# Patient Record
Sex: Male | Born: 1960 | Race: White | Hispanic: No | Marital: Married | Smoking: Never smoker
Health system: Southern US, Community
[De-identification: ages and names within clinical notes are randomized; demographics above are authoritative.]

## PROBLEM LIST (undated history)

## (undated) DIAGNOSIS — I10 Essential (primary) hypertension: Secondary | ICD-10-CM

## (undated) DIAGNOSIS — Z87898 Personal history of other specified conditions: Secondary | ICD-10-CM

## (undated) DIAGNOSIS — J45909 Unspecified asthma, uncomplicated: Secondary | ICD-10-CM

## (undated) DIAGNOSIS — T7840XA Allergy, unspecified, initial encounter: Secondary | ICD-10-CM

## (undated) HISTORY — DX: Allergy, unspecified, initial encounter: T78.40XA

## (undated) HISTORY — PX: NO PAST SURGERIES: SHX2092

## (undated) HISTORY — DX: Unspecified asthma, uncomplicated: J45.909

## (undated) HISTORY — PX: TREATMENT FISTULA ANAL: SUR1390

## (undated) HISTORY — DX: Essential (primary) hypertension: I10

## (undated) HISTORY — PX: MELANOMA EXCISION: SHX5266

---

## 2015-07-26 ENCOUNTER — Ambulatory Visit (INDEPENDENT_AMBULATORY_CARE_PROVIDER_SITE_OTHER): Payer: 59 | Admitting: Family Medicine

## 2015-07-26 ENCOUNTER — Encounter: Payer: Self-pay | Admitting: Family Medicine

## 2015-07-26 VITALS — BP 100/56 | HR 84 | Ht 70.0 in | Wt 225.0 lb

## 2015-07-26 DIAGNOSIS — W57XXXA Bitten or stung by nonvenomous insect and other nonvenomous arthropods, initial encounter: Secondary | ICD-10-CM | POA: Diagnosis not present

## 2015-07-26 DIAGNOSIS — K219 Gastro-esophageal reflux disease without esophagitis: Secondary | ICD-10-CM

## 2015-07-26 DIAGNOSIS — J309 Allergic rhinitis, unspecified: Secondary | ICD-10-CM | POA: Diagnosis not present

## 2015-07-26 DIAGNOSIS — E559 Vitamin D deficiency, unspecified: Secondary | ICD-10-CM

## 2015-07-26 DIAGNOSIS — I10 Essential (primary) hypertension: Secondary | ICD-10-CM | POA: Insufficient documentation

## 2015-07-26 DIAGNOSIS — R002 Palpitations: Secondary | ICD-10-CM | POA: Diagnosis not present

## 2015-07-26 DIAGNOSIS — S30861A Insect bite (nonvenomous) of abdominal wall, initial encounter: Secondary | ICD-10-CM | POA: Diagnosis not present

## 2015-07-26 DIAGNOSIS — Z79899 Other long term (current) drug therapy: Secondary | ICD-10-CM | POA: Diagnosis not present

## 2015-07-26 DIAGNOSIS — J4520 Mild intermittent asthma, uncomplicated: Secondary | ICD-10-CM | POA: Diagnosis not present

## 2015-07-26 DIAGNOSIS — E669 Obesity, unspecified: Secondary | ICD-10-CM | POA: Diagnosis not present

## 2015-07-26 MED ORDER — OMEPRAZOLE 20 MG PO CPDR: 20.0000 mg | DELAYED_RELEASE_CAPSULE | Freq: Every day | ORAL | Status: DC | PRN

## 2015-07-26 MED ORDER — ALBUTEROL SULFATE HFA 108 (90 BASE) MCG/ACT IN AERS: 2.0000 | INHALATION_SPRAY | RESPIRATORY_TRACT | Status: DC | PRN

## 2015-07-26 MED ORDER — LISINOPRIL-HYDROCHLOROTHIAZIDE 20-12.5 MG PO TABS: 1.0000 | ORAL_TABLET | Freq: Every day | ORAL | Status: DC

## 2015-07-27 DIAGNOSIS — Z79899 Other long term (current) drug therapy: Secondary | ICD-10-CM | POA: Insufficient documentation

## 2015-07-27 DIAGNOSIS — K219 Gastro-esophageal reflux disease without esophagitis: Secondary | ICD-10-CM | POA: Insufficient documentation

## 2015-07-27 DIAGNOSIS — J4520 Mild intermittent asthma, uncomplicated: Secondary | ICD-10-CM | POA: Insufficient documentation

## 2015-07-27 DIAGNOSIS — R002 Palpitations: Secondary | ICD-10-CM | POA: Insufficient documentation

## 2015-07-27 DIAGNOSIS — E559 Vitamin D deficiency, unspecified: Secondary | ICD-10-CM | POA: Insufficient documentation

## 2015-07-27 LAB — LIPID PANEL
CHOL/HDL RATIO: 5.2 ratio — AB (ref 0.0–5.0)
Cholesterol, Total: 173 mg/dL (ref 100–199)
HDL: 33 mg/dL — ABNORMAL LOW (ref >39)
LDL CALC: 98 mg/dL (ref 0–99)
TRIGLYCERIDES: 208 mg/dL — AB (ref 0–149)
VLDL CHOLESTEROL CAL: 42 mg/dL — AB (ref 5–40)

## 2015-07-27 LAB — VITAMIN B12: Vitamin B-12: 371 pg/mL (ref 211–946)

## 2015-07-27 LAB — CBC
HEMATOCRIT: 45.8 % (ref 37.5–51.0)
HEMOGLOBIN: 16.3 g/dL (ref 12.6–17.7)
MCH: 31 pg (ref 26.6–33.0)
MCHC: 35.6 g/dL (ref 31.5–35.7)
MCV: 87 fL (ref 79–97)
Platelets: 249 10*3/uL (ref 150–379)
RBC: 5.25 x10E6/uL (ref 4.14–5.80)
RDW: 13.7 % (ref 12.3–15.4)
WBC: 11 10*3/uL — ABNORMAL HIGH (ref 3.4–10.8)

## 2015-07-27 LAB — COMPREHENSIVE METABOLIC PANEL
ALT: 19 IU/L (ref 0–44)
AST: 15 IU/L (ref 0–40)
Albumin/Globulin Ratio: 2.3 (ref 1.1–2.5)
Albumin: 4.4 g/dL (ref 3.5–5.5)
Alkaline Phosphatase: 59 IU/L (ref 39–117)
BUN/Creatinine Ratio: 18 (ref 9–20)
BUN: 20 mg/dL (ref 6–24)
Bilirubin Total: 0.5 mg/dL (ref 0.0–1.2)
CALCIUM: 9.3 mg/dL (ref 8.7–10.2)
CHLORIDE: 98 mmol/L (ref 97–106)
CO2: 25 mmol/L (ref 18–29)
Creatinine, Ser: 1.1 mg/dL (ref 0.76–1.27)
GFR, EST AFRICAN AMERICAN: 86 mL/min/{1.73_m2} (ref >59)
GFR, EST NON AFRICAN AMERICAN: 75 mL/min/{1.73_m2} (ref >59)
GLUCOSE: 87 mg/dL (ref 65–99)
Globulin, Total: 1.9 g/dL (ref 1.5–4.5)
Potassium: 4.7 mmol/L (ref 3.5–5.2)
Sodium: 140 mmol/L (ref 136–144)
TOTAL PROTEIN: 6.3 g/dL (ref 6.0–8.5)

## 2015-07-27 LAB — TSH: TSH: 1.49 u[IU]/mL (ref 0.450–4.500)

## 2015-07-27 LAB — VITAMIN D 25 HYDROXY (VIT D DEFICIENCY, FRACTURES): Vit D, 25-Hydroxy: 9.8 ng/mL — ABNORMAL LOW (ref 30.0–100.0)

## 2015-07-27 MED ORDER — VITAMIN D 50 MCG (2000 UT) PO CAPS: 1.0000 | ORAL_CAPSULE | Freq: Every day | ORAL | Status: DC

## 2015-07-27 NOTE — Progress Notes (Signed)
Date:  07/26/2015   Name:  Ronald Fry   DOB:  Jul 25, 1961   MRN:  [id number]  PCP:  No primary care provider on file.    Chief Complaint: Establish Care   History of Present Illness:  This is a 54 y.o. male recently relocated from [place] with tick bite R lateral abdomen, tick removed yesterday, probably there since last week. Mild erythema/itching, looks less red today. Hx HTN on Prinzide for past year, no se's noted. On Prilosec for GERD off and on for years, thinks he can probably wean off. Hx AR in spring and fall, well controlled on Zyrtec/Flonase, has had allergy testing and allergy shots in past. Told TG high in past. Hx asthma associated with AR, well controlled on prn albuterol MDI. Hx heart palpitations had neg cardiac w/u two years ago, none since starting Prinzide last year. Weight up 5# past 6 months, no regular exercise. Tetanus 4 yrs ago, flu imm last month, has also had pneumovax and zoster imms. Had colonoscopy 6 yrs ago no abnormal findings.  Review of Systems:  Review of Systems  Constitutional: Negative for fever, chills and fatigue.  HENT: Negative for ear pain, sore throat and trouble swallowing.   Eyes: Negative for pain.  Respiratory: Negative for cough and shortness of breath.   Cardiovascular: Negative for chest pain and leg swelling.  Gastrointestinal: Negative for abdominal pain, diarrhea and constipation.  Endocrine: Negative for polyuria.  Genitourinary: Negative for difficulty urinating.  Neurological: Negative for syncope and light-headedness.    Patient Active Problem List   Diagnosis Date Noted  . Intermittent palpitations 07/27/2015  . Esophageal reflux 07/27/2015  . Asthma, mild intermittent 07/27/2015  . Rhinitis, allergic 07/27/2015  . Current use of proton pump inhibitor 07/27/2015  . Hypertension 07/26/2015  . Obesity, Class I, BMI 30-34.9 07/26/2015    Prior to Admission medications   Medication Sig Start Date End Date Taking?  Authorizing Provider  albuterol (PROVENTIL HFA;VENTOLIN HFA) 108 (90 BASE) MCG/ACT inhaler Inhale 2 puffs into the lungs every 4 (four) hours as needed for wheezing or shortness of breath. 07/26/15  Yes Adline Potter, MD  aspirin 81 MG tablet Take 81 mg by mouth daily.   Yes Historical Provider, MD  cetirizine (ZYRTEC) 10 MG tablet Take 10 mg by mouth daily.   Yes Historical Provider, MD  fluticasone (FLONASE) 50 MCG/ACT nasal spray Place into both nostrils daily.   Yes Historical Provider, MD  lisinopril-hydrochlorothiazide (PRINZIDE,ZESTORETIC) 20-12.5 MG tablet Take 1 tablet by mouth daily. 07/26/15  Yes Adline Potter, MD  omeprazole (PRILOSEC) 20 MG capsule Take 1 capsule (20 mg total) by mouth daily as needed. 07/26/15  Yes Adline Potter, MD    No Known Allergies  No past surgical history on file.  Social History  Substance Use Topics  . Smoking status: Never Smoker   . Smokeless tobacco: None  . Alcohol Use: 0.0 oz/week    0 Standard drinks or equivalent per week    Family History  Problem Relation Age of Onset  . Stroke Mother   . Stroke Father     Medication list has been reviewed and updated.  Physical Examination: BP 100/56 mmHg  Pulse 84  Ht 5\' 10"  (1.778 m)  Wt 225 lb (102.059 kg)  BMI 32.28 kg/m2  SpO2 96%  Physical Exam  Constitutional: He is oriented to person, place, and time. He appears well-developed and well-nourished.  HENT:  Head: Normocephalic and atraumatic.  Right Ear: External ear normal.  Left  Ear: External ear normal.  Nose: Nose normal.  Mouth/Throat: Oropharynx is clear and moist.  TM's clear  Eyes: Conjunctivae and EOM are normal. Pupils are equal, round, and reactive to light.  Neck: Neck supple. No thyromegaly present.  Cardiovascular: Normal rate, regular rhythm and normal heart sounds.   Pulmonary/Chest: Effort normal and breath sounds normal.  Abdominal: Soft. He exhibits no distension and no mass. There is no tenderness.   Genitourinary: Penis normal.  Testes normal, no hernia  Musculoskeletal: He exhibits no edema.  Lymphadenopathy:    He has no cervical adenopathy.  Neurological: He is alert and oriented to person, place, and time.  Skin: Skin is warm and dry.  Bite R flank with minimal erythema, no discharge  Psychiatric: He has a normal mood and affect. His behavior is normal.  Nursing note and vitals reviewed.   Assessment and Plan:  1. Tick bite of abdomen, initial encounter Healing well, observe for ECM or fever  2. Essential hypertension Well controlled, continue Prinzide - Comprehensive Metabolic Panel (CMET) - CBC  3. Obesity, Class I, BMI 30-34.9 Discussed exercise 150 mins/wk and weight loss - Lipid Profile - Vitamin D (25 hydroxy) - TSH  4. Allergic rhinitis, unspecified allergic rhinitis type Seasonal, well controlled, cont prn Zyrtec/Flonase  5. Asthma, mild intermittent, uncomplicated Well controlled, cont prn albuterol MDI  6. Gastroesophageal reflux disease, esophagitis presence not specified Discussed potential se's chronic PPI use, will try to wean off  7. Current use of proton pump inhibitor - B12  8. Intermittent palpitations S/p negative cardiac w/u, discussed possible BB use if recur  Return in about 6 months (around 01/23/2016).  Satira Anis. Catheryne Deford, [facility]  07/27/2015

## 2015-07-27 NOTE — Addendum Note (Signed)
Addended by: Adline Potter on: 07/27/2015 09:29 AM   Modules accepted: Orders, Medications

## 2015-10-28 ENCOUNTER — Encounter: Payer: Self-pay | Admitting: Family Medicine

## 2015-10-28 ENCOUNTER — Ambulatory Visit (INDEPENDENT_AMBULATORY_CARE_PROVIDER_SITE_OTHER): Payer: 59 | Admitting: Family Medicine

## 2015-10-28 VITALS — BP 110/68 | HR 72 | Ht 70.0 in | Wt 225.0 lb

## 2015-10-28 DIAGNOSIS — E669 Obesity, unspecified: Secondary | ICD-10-CM | POA: Diagnosis not present

## 2015-10-28 DIAGNOSIS — K219 Gastro-esophageal reflux disease without esophagitis: Secondary | ICD-10-CM

## 2015-10-28 DIAGNOSIS — J309 Allergic rhinitis, unspecified: Secondary | ICD-10-CM

## 2015-10-28 DIAGNOSIS — E559 Vitamin D deficiency, unspecified: Secondary | ICD-10-CM

## 2015-10-28 DIAGNOSIS — R002 Palpitations: Secondary | ICD-10-CM

## 2015-10-28 DIAGNOSIS — M7662 Achilles tendinitis, left leg: Secondary | ICD-10-CM

## 2015-10-28 DIAGNOSIS — I10 Essential (primary) hypertension: Secondary | ICD-10-CM | POA: Diagnosis not present

## 2015-10-28 DIAGNOSIS — Z8639 Personal history of other endocrine, nutritional and metabolic disease: Secondary | ICD-10-CM | POA: Insufficient documentation

## 2015-10-28 NOTE — Progress Notes (Addendum)
Date:  10/28/2015   Name:  Ronald Fry   DOB:  08-24-1961   MRN:  [id number]  PCP:  No primary care provider on file.    Chief Complaint: Tendonitis   History of Present Illness:  This is a 55 y.o. male for 4 month f/u. Injured L Achilles in June, more painful lately (up to 5/10), worse with prolonged walking. Weight stable, AR stable on Flonase/Zyrtec, weaned off Prilosec (none in past 6 weeks), palpitations improved, taking vit D supplement. Hx low testosterone on shots in past, none in past year.  Review of Systems:  Review of Systems  Constitutional: Negative for fever and unexpected weight change.  Respiratory: Negative for shortness of breath.   Cardiovascular: Negative for chest pain and leg swelling.  Endocrine: Negative for polyuria.  Genitourinary: Negative for difficulty urinating.  Neurological: Negative for syncope and light-headedness.    Patient Active Problem List   Diagnosis Date Noted  . Hx of hypotestosteronemia 10/28/2015  . Intermittent palpitations 07/27/2015  . Esophageal reflux 07/27/2015  . Asthma, mild intermittent 07/27/2015  . Rhinitis, allergic 07/27/2015  . Vitamin D deficiency 07/27/2015  . Hypertension 07/26/2015  . Obesity, Class I, BMI 30-34.9 07/26/2015    Prior to Admission medications   Medication Sig Start Date End Date Taking? Authorizing Provider  albuterol (PROVENTIL HFA;VENTOLIN HFA) 108 (90 BASE) MCG/ACT inhaler Inhale 2 puffs into the lungs every 4 (four) hours as needed for wheezing or shortness of breath. 07/26/15  Yes Adline Potter, MD  cetirizine (ZYRTEC) 10 MG tablet Take 10 mg by mouth daily.   Yes Historical Provider, MD  Cholecalciferol (VITAMIN D) 2000 UNITS CAPS Take 1 capsule (2,000 Units total) by mouth daily. 07/27/15  Yes Adline Potter, MD  fluticasone (FLONASE) 50 MCG/ACT nasal spray Place into both nostrils daily.   Yes Historical Provider, MD  lisinopril-hydrochlorothiazide (PRINZIDE,ZESTORETIC) 20-12.5 MG tablet  Take 1 tablet by mouth daily. 07/26/15  Yes Adline Potter, MD  omeprazole (PRILOSEC) 20 MG capsule Take 1 capsule (20 mg total) by mouth daily as needed. 07/26/15  Yes Adline Potter, MD    No Known Allergies  Past Surgical History  Procedure Laterality Date  . No past surgeries      Social History  Substance Use Topics  . Smoking status: Never Smoker   . Smokeless tobacco: None  . Alcohol Use: 0.0 oz/week    0 Standard drinks or equivalent per week    Family History  Problem Relation Age of Onset  . Stroke Mother   . Stroke Father     Medication list has been reviewed and updated.  Physical Examination: BP 110/68 mmHg  Pulse 72  Ht 5\' 10"  (1.778 m)  Wt 225 lb (102.059 kg)  BMI 32.28 kg/m2  Physical Exam  Constitutional: He appears well-developed and well-nourished.  Cardiovascular: Normal rate, regular rhythm and normal heart sounds.   Pulmonary/Chest: Effort normal and breath sounds normal.  Musculoskeletal: He exhibits no edema.  Tender nodule L mid Achilles  Neurological: He is alert.  Skin: Skin is warm and dry.  Psychiatric: He has a normal mood and affect. His behavior is normal.  Nursing note and vitals reviewed.   Assessment and Plan:  1. Essential hypertension Overcontrolled past two visits, d/c Prinzide  2. Vitamin D deficiency On supplement - Vitamin D (25 hydroxy)  3. Gastroesophageal reflux disease, esophagitis presence not specified On PPI prn only  4. Allergic rhinitis, unspecified allergic rhinitis type Cont Flonase/Zyrtec, consider Allegra or Claritin if Zyrtec  ineffective  5. Obesity, Class I, BMI 30-34.9 Exercise/weight loss discussed  6. Intermittent palpitations Improved  7. Hx of hypotestosteronemia - Testosterone  8. L Achilles tendonitis Refer podiatry  Return in about 4 weeks (around 11/25/2015).  Satira Anis. [facility]  10/28/2015

## 2015-10-28 NOTE — Progress Notes (Signed)
Addendum:  Primary dx L Achilles tendonitis - Refer podiatry

## 2015-10-28 NOTE — Addendum Note (Signed)
Addended by: Adline Potter on: 10/28/2015 04:44 PM   Modules accepted: Orders

## 2015-11-01 DIAGNOSIS — E559 Vitamin D deficiency, unspecified: Secondary | ICD-10-CM | POA: Diagnosis not present

## 2015-11-01 DIAGNOSIS — Z8639 Personal history of other endocrine, nutritional and metabolic disease: Secondary | ICD-10-CM | POA: Diagnosis not present

## 2015-11-02 ENCOUNTER — Telehealth: Payer: Self-pay

## 2015-11-02 DIAGNOSIS — E349 Endocrine disorder, unspecified: Secondary | ICD-10-CM | POA: Insufficient documentation

## 2015-11-02 LAB — VITAMIN D 25 HYDROXY (VIT D DEFICIENCY, FRACTURES): Vit D, 25-Hydroxy: 26.4 ng/mL — ABNORMAL LOW (ref 30.0–100.0)

## 2015-11-02 LAB — TESTOSTERONE: Testosterone: 329 ng/dL — ABNORMAL LOW (ref 348–1197)

## 2015-11-02 NOTE — Telephone Encounter (Signed)
Spoke with patient. Patient advised of all results and verbalized understanding. Will call back with any future questions or concerns. MAH  

## 2015-11-02 NOTE — Telephone Encounter (Signed)
-----   Message from Adline Potter, MD sent at 11/02/2015 10:12 AM EST ----- Inform vit D level improved and testosterone level low, recommend continue current vit D supplement, will discuss testosterone next visit.

## 2015-11-17 DIAGNOSIS — M79672 Pain in left foot: Secondary | ICD-10-CM | POA: Diagnosis not present

## 2015-11-17 DIAGNOSIS — M7662 Achilles tendinitis, left leg: Secondary | ICD-10-CM | POA: Diagnosis not present

## 2015-12-01 ENCOUNTER — Ambulatory Visit (INDEPENDENT_AMBULATORY_CARE_PROVIDER_SITE_OTHER): Payer: 59 | Admitting: Family Medicine

## 2015-12-01 ENCOUNTER — Encounter: Payer: Self-pay | Admitting: Family Medicine

## 2015-12-01 VITALS — BP 118/82 | HR 66 | Temp 98.3°F | Resp 16 | Ht 70.0 in | Wt 228.0 lb

## 2015-12-01 DIAGNOSIS — E669 Obesity, unspecified: Secondary | ICD-10-CM

## 2015-12-01 DIAGNOSIS — J309 Allergic rhinitis, unspecified: Secondary | ICD-10-CM | POA: Diagnosis not present

## 2015-12-01 DIAGNOSIS — I10 Essential (primary) hypertension: Secondary | ICD-10-CM

## 2015-12-01 DIAGNOSIS — E291 Testicular hypofunction: Secondary | ICD-10-CM | POA: Diagnosis not present

## 2015-12-01 DIAGNOSIS — E66811 Obesity, class 1: Secondary | ICD-10-CM

## 2015-12-01 DIAGNOSIS — K219 Gastro-esophageal reflux disease without esophagitis: Secondary | ICD-10-CM | POA: Diagnosis not present

## 2015-12-01 DIAGNOSIS — E559 Vitamin D deficiency, unspecified: Secondary | ICD-10-CM

## 2015-12-01 DIAGNOSIS — E349 Endocrine disorder, unspecified: Secondary | ICD-10-CM

## 2015-12-01 NOTE — Progress Notes (Signed)
Date:  12/01/2015   Name:  Ronald Fry   DOB:  [birth year]   MRN:  [id number]  PCP:  Adline Potter, MD    Chief Complaint: Hypertension   History of Present Illness:  This is a 55 y.o. male seen for one month f/u. Saw podiatry for Achilles tendonitis, better with Lodine and heel lift. Prinzide stopped last visit. Taking omeprazole prn only. On daily Zyrtec/Flonase for allergies with adequate control. Weight up 3#, unable to exercise with tendonitis. Blood work showed vit D def and and low testosterone, taking vit D supplement.   Review of Systems:  Review of Systems  Constitutional: Negative for fever and fatigue.  Respiratory: Negative for cough and shortness of breath.   Cardiovascular: Negative for chest pain and leg swelling.  Endocrine: Negative for polyuria.  Genitourinary: Negative for difficulty urinating.  Neurological: Negative for syncope and light-headedness.    Patient Active Problem List   Diagnosis Date Noted  . Hypotestosteronism 11/02/2015  . Intermittent palpitations 07/27/2015  . Esophageal reflux 07/27/2015  . Asthma, mild intermittent 07/27/2015  . Rhinitis, allergic 07/27/2015  . Vitamin D deficiency 07/27/2015  . Hypertension 07/26/2015  . Obesity, Class I, BMI 30-34.9 07/26/2015    Prior to Admission medications   Medication Sig Start Date End Date Taking? Authorizing Provider  albuterol (PROVENTIL HFA;VENTOLIN HFA) 108 (90 BASE) MCG/ACT inhaler Inhale 2 puffs into the lungs every 4 (four) hours as needed for wheezing or shortness of breath. 07/26/15  Yes Adline Potter, MD  cetirizine (ZYRTEC) 10 MG tablet Take 10 mg by mouth daily.   Yes Historical Provider, MD  Cholecalciferol (VITAMIN D) 2000 UNITS CAPS Take 1 capsule (2,000 Units total) by mouth daily. 07/27/15  Yes Adline Potter, MD  etodolac (LODINE) 500 MG tablet  11/17/15  Yes Historical Provider, MD  fluticasone (FLONASE) 50 MCG/ACT nasal spray Place into both nostrils daily.   Yes Historical  Provider, MD  omeprazole (PRILOSEC) 20 MG capsule Take 1 capsule (20 mg total) by mouth daily as needed. 07/26/15  Yes Adline Potter, MD    No Known Allergies  Past Surgical History  Procedure Laterality Date  . No past surgeries      Social History  Substance Use Topics  . Smoking status: Never Smoker   . Smokeless tobacco: None  . Alcohol Use: 0.0 oz/week    0 Standard drinks or equivalent per week    Family History  Problem Relation Age of Onset  . Stroke Mother   . Stroke Father     Medication list has been reviewed and updated.  Physical Examination: BP 118/82 mmHg  Pulse 66  Temp(Src) 98.3 F (36.8 C) (Oral)  Resp 16  Ht 5\' 10"  (1.778 m)  Wt 228 lb (103.42 kg)  BMI 32.71 kg/m2  SpO2 96%  Physical Exam  Constitutional: He appears well-developed and well-nourished.  Cardiovascular: Normal rate, regular rhythm and normal heart sounds.   Pulmonary/Chest: Effort normal and breath sounds normal.  Musculoskeletal: He exhibits no edema.  Neurological: He is alert.  Skin: Skin is warm and dry.  Psychiatric: He has a normal mood and affect. His behavior is normal.  Nursing note and vitals reviewed.   Assessment and Plan:  1. Essential hypertension BP ok off Prinzide  2. Allergic rhinitis, unspecified allergic rhinitis type Well controlled on Zyrtec/Flonase  3. Gastroesophageal reflux disease, esophagitis presence not specified Taking Prilosec prn only  4. Obesity, Class I, BMI 30-34.9 Discussed diet/exercise, offered MNT referral, pt will consider  5. Hypotestosteronism On injections in past, willing to try weight loss, consider repeat level next visit  6. Vitamin D deficiency On supplement, consider repeat level next visit  Return in about 3 months (around 03/01/2016).  Satira Anis. [facility]  12/01/2015

## 2016-05-11 DIAGNOSIS — H5213 Myopia, bilateral: Secondary | ICD-10-CM | POA: Diagnosis not present

## 2016-08-07 ENCOUNTER — Ambulatory Visit (INDEPENDENT_AMBULATORY_CARE_PROVIDER_SITE_OTHER): Payer: 59 | Admitting: Internal Medicine

## 2016-08-07 ENCOUNTER — Other Ambulatory Visit: Payer: Self-pay | Admitting: *Deleted

## 2016-08-07 ENCOUNTER — Encounter: Payer: Self-pay | Admitting: Internal Medicine

## 2016-08-07 VITALS — BP 120/84 | HR 72 | Temp 98.2°F | Ht 70.0 in | Wt 223.0 lb

## 2016-08-07 DIAGNOSIS — J3089 Other allergic rhinitis: Secondary | ICD-10-CM

## 2016-08-07 DIAGNOSIS — K219 Gastro-esophageal reflux disease without esophagitis: Secondary | ICD-10-CM

## 2016-08-07 DIAGNOSIS — Z23 Encounter for immunization: Secondary | ICD-10-CM

## 2016-08-07 NOTE — Patient Instructions (Signed)
DASH Eating Plan DASH stands for "Dietary Approaches to Stop Hypertension." The DASH eating plan is a healthy eating plan that has been shown to reduce high blood pressure (hypertension). Additional health benefits may include reducing the risk of type 2 diabetes mellitus, heart disease, and stroke. The DASH eating plan may also help with weight loss. What do I need to know about the DASH eating plan? For the DASH eating plan, you will follow these general guidelines:  Choose foods with less than 150 milligrams of sodium per serving (as listed on the food label).  Use salt-free seasonings or herbs instead of table salt or sea salt.  Check with your health care provider or pharmacist before using salt substitutes.  Eat lower-sodium products. These are often labeled as "low-sodium" or "no salt added."  Eat fresh foods. Avoid eating a lot of canned foods.  Eat more vegetables, fruits, and low-fat dairy products.  Choose whole grains. Look for the word "whole" as the first word in the ingredient list.  Choose fish and skinless chicken or turkey more often than red meat. Limit fish, poultry, and meat to 6 oz (170 g) each day.  Limit sweets, desserts, sugars, and sugary drinks.  Choose heart-healthy fats.  Eat more home-cooked food and less restaurant, buffet, and fast food.  Limit fried foods.  Do not fry foods. Cook foods using methods such as baking, boiling, grilling, and broiling instead.  When eating at a restaurant, ask that your food be prepared with less salt, or no salt if possible. What foods can I eat? Seek help from a dietitian for individual calorie needs. Grains  Whole grain or whole wheat bread. Brown rice. Whole grain or whole wheat pasta. Quinoa, bulgur, and whole grain cereals. Low-sodium cereals. Corn or whole wheat flour tortillas. Whole grain cornbread. Whole grain crackers. Low-sodium crackers. Vegetables  Fresh or frozen vegetables (raw, steamed, roasted, or  grilled). Low-sodium or reduced-sodium tomato and vegetable juices. Low-sodium or reduced-sodium tomato sauce and paste. Low-sodium or reduced-sodium canned vegetables. Fruits  All fresh, canned (in natural juice), or frozen fruits. Meat and Other Protein Products  Ground beef (85% or leaner), grass-fed beef, or beef trimmed of fat. Skinless chicken or turkey. Ground chicken or turkey. Pork trimmed of fat. All fish and seafood. Eggs. Dried beans, peas, or lentils. Unsalted nuts and seeds. Unsalted canned beans. Dairy  Low-fat dairy products, such as skim or 1% milk, 2% or reduced-fat cheeses, low-fat ricotta or cottage cheese, or plain low-fat yogurt. Low-sodium or reduced-sodium cheeses. Fats and Oils  Tub margarines without trans fats. Light or reduced-fat mayonnaise and salad dressings (reduced sodium). Avocado. Safflower, olive, or canola oils. Natural peanut or almond butter. Other  Unsalted popcorn and pretzels. The items listed above may not be a complete list of recommended foods or beverages. Contact your dietitian for more options.  What foods are not recommended? Grains  White bread. White pasta. White rice. Refined cornbread. Bagels and croissants. Crackers that contain trans fat. Vegetables  Creamed or fried vegetables. Vegetables in a cheese sauce. Regular canned vegetables. Regular canned tomato sauce and paste. Regular tomato and vegetable juices. Fruits  Canned fruit in light or heavy syrup. Fruit juice. Meat and Other Protein Products  Fatty cuts of meat. Ribs, chicken wings, bacon, sausage, bologna, salami, chitterlings, fatback, hot dogs, bratwurst, and packaged luncheon meats. Salted nuts and seeds. Canned beans with salt. Dairy  Whole or 2% milk, cream, half-and-half, and cream cheese. Whole-fat or sweetened yogurt. Full-fat cheeses   or blue cheese. Nondairy creamers and whipped toppings. Processed cheese, cheese spreads, or cheese curds. Condiments  Onion and garlic  salt, seasoned salt, table salt, and sea salt. Canned and packaged gravies. Worcestershire sauce. Tartar sauce. Barbecue sauce. Teriyaki sauce. Soy sauce, including reduced sodium. Steak sauce. Fish sauce. Oyster sauce. Cocktail sauce. Horseradish. Ketchup and mustard. Meat flavorings and tenderizers. Bouillon cubes. Hot sauce. Tabasco sauce. Marinades. Taco seasonings. Relishes. Fats and Oils  Butter, stick margarine, lard, shortening, ghee, and bacon fat. Coconut, palm kernel, or palm oils. Regular salad dressings. Other  Pickles and olives. Salted popcorn and pretzels. The items listed above may not be a complete list of foods and beverages to avoid. Contact your dietitian for more information.  Where can I find more information? National Heart, Lung, and Blood Institute: www.nhlbi.nih.gov/health/health-topics/topics/dash/ This information is not intended to replace advice given to you by your health care provider. Make sure you discuss any questions you have with your health care provider. Document Released: 08/03/2011 Document Revised: 01/20/2016 Document Reviewed: 06/18/2013 Elsevier Interactive Patient Education  2017 Elsevier Inc.  

## 2016-08-07 NOTE — Progress Notes (Signed)
Date:  08/07/2016   Name:  Ronald Fry   DOB:  Apr 20, 1961   MRN:  [id number]   Chief Complaint: Hypertension and Flu Vaccine Hypertension  The current episode started more than 1 year ago. The problem has been resolved since onset. The problem is controlled. Pertinent negatives include no chest pain, headaches, palpitations or shortness of breath. There are no associated agents to hypertension. Past treatments include ACE inhibitors. The current treatment provides moderate improvement. There are no compliance problems (was able to stop medications after some time).   Gastroesophageal Reflux  He complains of heartburn. He reports no abdominal pain, no chest pain, no coughing, no sore throat or no wheezing. This is a recurrent problem. The problem occurs occasionally. The symptoms are aggravated by certain foods and lying down. Pertinent negatives include no fatigue. He has tried a PPI (omeprazole PRN) for the symptoms. The treatment provided significant relief.  Asthma/allergies -takes medications seasonally as needed.  Needs to get flu shot today.  BP Readings from Last 3 Encounters:  08/07/16 138/72  12/01/15 118/82  10/28/15 110/68   Wt Readings from Last 3 Encounters:  08/07/16 223 lb (101.2 kg)  12/01/15 228 lb (103.4 kg)  10/28/15 225 lb (102.1 kg)     Review of Systems  Constitutional: Negative for appetite change, fatigue and unexpected weight change.  HENT: Negative for dental problem, sore throat and tinnitus.   Eyes: Negative for visual disturbance.  Respiratory: Negative for cough, chest tightness, shortness of breath and wheezing.   Cardiovascular: Negative for chest pain, palpitations and leg swelling.  Gastrointestinal: Positive for heartburn. Negative for abdominal pain and blood in stool.  Endocrine: Negative for polydipsia and polyuria.  Genitourinary: Negative for dysuria and hematuria.  Musculoskeletal: Negative for arthralgias and back pain.  Skin:  Negative for color change and rash.  Allergic/Immunologic: Positive for environmental allergies.  Neurological: Negative for tremors, numbness and headaches.  Psychiatric/Behavioral: Negative for dysphoric mood.    Patient Active Problem List   Diagnosis Date Noted  . Environmental and seasonal allergies 08/07/2016  . Hypotestosteronism 11/02/2015  . Intermittent palpitations 07/27/2015  . Gastroesophageal reflux disease 07/27/2015  . Asthma, mild intermittent 07/27/2015  . Rhinitis, allergic 07/27/2015  . Vitamin D deficiency 07/27/2015  . Obesity, Class I, BMI 30-34.9 07/26/2015    Prior to Admission medications   Medication Sig Start Date End Date Taking? Authorizing Provider  albuterol (PROVENTIL HFA;VENTOLIN HFA) 108 (90 BASE) MCG/ACT inhaler Inhale 2 puffs into the lungs every 4 (four) hours as needed for wheezing or shortness of breath. 07/26/15  Yes Adline Potter, MD  cetirizine (ZYRTEC) 10 MG tablet Take 10 mg by mouth daily.   Yes Historical Provider, MD  fluticasone (FLONASE) 50 MCG/ACT nasal spray Place into both nostrils daily.   Yes Historical Provider, MD  omeprazole (PRILOSEC) 20 MG capsule Take 1 capsule (20 mg total) by mouth daily as needed. 07/26/15  Yes Adline Potter, MD       Adline Potter, MD       Historical Provider, MD    No Known Allergies  Past Surgical History:  Procedure Laterality Date  . NO PAST SURGERIES      Social History  Substance Use Topics  . Smoking status: Never Smoker  . Smokeless tobacco: Never Used  . Alcohol use 0.0 oz/week     Medication list has been reviewed and updated.   Physical Exam  Constitutional: He is oriented to person, place, and time. He appears well-developed.  No distress.  HENT:  Head: Normocephalic and atraumatic.  Neck: Normal range of motion. Neck supple. No thyromegaly present.  Cardiovascular: Normal rate and normal heart sounds.   Pulmonary/Chest: Effort normal and breath sounds normal. No  respiratory distress.  Musculoskeletal: Normal range of motion. He exhibits no edema or tenderness.  Neurological: He is alert and oriented to person, place, and time.  Skin: Skin is warm and dry. No rash noted.  Psychiatric: He has a normal mood and affect. His behavior is normal. Thought content normal.  Nursing note and vitals reviewed.   BP 138/72   Pulse 72   Temp 98.2 F (36.8 C)   Ht 5\' 10"  (1.778 m)   Wt 223 lb (101.2 kg)   BMI 32.00 kg/m   Assessment and Plan: 1. Gastroesophageal reflux disease, esophagitis presence not specified Controlled on PRN PPI  2. Environmental and seasonal allergies Continue otc meds and albuterol as needed  3. Encounter for immunization - Flu Vaccine QUAD 36+ mos IM   Halina Maidens, MD [facility]  08/07/2016

## 2016-08-10 ENCOUNTER — Ambulatory Visit
Admission: EM | Admit: 2016-08-10 | Discharge: 2016-08-10 | Disposition: A | Discharge status: Home / Self Care | Payer: 59 | Attending: Emergency Medicine | Admitting: Emergency Medicine

## 2016-08-10 ENCOUNTER — Encounter: Payer: Self-pay | Admitting: *Deleted

## 2016-08-10 DIAGNOSIS — M79662 Pain in left lower leg: Secondary | ICD-10-CM

## 2016-08-10 NOTE — ED Triage Notes (Signed)
Gradual onset left calf pain x2 days. At home this evening, stood to walk and felt sudden signifcant increase in pain to left calf. Pt denies injury.

## 2016-08-10 NOTE — ED Provider Notes (Signed)
HPI  SUBJECTIVE:  Ronald Fry is a 55 y.o. male who presents with 2-3 days of left calf pain. Was walking today and reports acute worsening of his pain. Reports calf swelling starting today. He states that the pain feels like a "pulled muscle". States is intermittent, and depends on his activity. Symptoms worse with walking, no alleviating factors. He has not tried anything for this. He denies any changes physical activity, trauma to his calf. States that he was not going up or down any stairs when his pain got acutely worse. Chest pain, shortness of breath. No recent fluoroquinolone use. No history of cancer, lower extremity paralysis, long plane trip, bedridden for 3 days, surgery in the past 4 weeks, entire leg swelling. He did go to [place] and  back last week. He has a past medical history of hypertension but does not require medications. No history DVT, PE, cancer, diabetes. PMD: Dr. Army Melia.    Past Medical History:  Diagnosis Date  . Allergy   . Asthma   . Hypertension     Past Surgical History:  Procedure Laterality Date  . NO PAST SURGERIES      Family History  Problem Relation Age of Onset  . Stroke Mother   . Stroke Father     Social History  Substance Use Topics  . Smoking status: Never Smoker  . Smokeless tobacco: Never Used  . Alcohol use 0.0 oz/week    No current facility-administered medications for this encounter.   Current Outpatient Prescriptions:  .  cetirizine (ZYRTEC) 10 MG tablet, Take 10 mg by mouth daily., Disp: , Rfl:  .  fluticasone (FLONASE) 50 MCG/ACT nasal spray, Place into both nostrils daily., Disp: , Rfl:  .  omeprazole (PRILOSEC) 20 MG capsule, Take 1 capsule (20 mg total) by mouth daily as needed., Disp: 30 capsule, Rfl: 2 .  albuterol (PROVENTIL HFA;VENTOLIN HFA) 108 (90 BASE) MCG/ACT inhaler, Inhale 2 puffs into the lungs every 4 (four) hours as needed for wheezing or shortness of breath., Disp: 6.7 g, Rfl: 2  No Known  Allergies   ROS  As noted in HPI.   Physical Exam  BP 131/90 (BP Location: Left Arm)   Pulse 79   Temp 98.2 F (36.8 C) (Oral)   Resp 16   Ht 5\' 10"  (1.778 m)   Wt 233 lb (105.7 kg)   SpO2 98%   BMI 33.43 kg/m   Constitutional: Well developed, well nourished, no acute distress Eyes:  EOMI, conjunctiva normal bilaterally HENT: Normocephalic, atraumatic,mucus membranes moist Respiratory: Normal inspiratory effort Cardiovascular: Normal rate GI: nondistended skin: No rash, skin intact Musculoskeletal: calves symmetric, Right calf measures 40-41.5 cm, left calf measures 40-41.5 cm. Positive tenderness lateral left calf. No popliteal tenderness, no palpable cord. Negative Homans sign. Positive trace edema bilaterally slightly more so on the left than the right. No knee tenderness, rest of the leg exam is normal. Neurologic: Alert & oriented x 3, no focal neuro deficits Psychiatric: Speech and behavior appropriate   ED Course   Medications - No data to display  No orders of the defined types were placed in this encounter.   No results found for this or any previous visit (from the past 24 hour(s)). No results found.  ED Clinical Impression  Pain of left calf   ED Assessment/Plan  Concern for DVT. His Wells criteria is anywhere from 0-2/9. Discussed that he has a 9-22% chance of having a DVT. Discussed with him the possibly of going to  the ER and having his ultrasound done tonight, but patient has opted to wait until he can be seen by his primary care physician tomorrow. Advised patient to go immediately to the ER tonight for chest pain, shortness of breath, if his leg swelling gets worse or for any concerns. He will go to the ER if he is unable to be seen by his primary care physician tomorrow. We'll send a note to Dr. Army Melia regarding today's visit. Discussed medical decision-making, plan for follow-up, signs and symptoms that should prompt return to emergency  department. Patient agrees with plan.   No orders of the defined types were placed in this encounter.   *This clinic note was created using Dragon dictation software. Therefore, there may be occasional mistakes despite careful proofreading.  ?   Melynda Ripple, MD 08/10/16 2144

## 2016-08-10 NOTE — Discharge Instructions (Signed)
You must be seen by Dr. Army Melia tomorrow. If you cannot be seen by her, then go to the ER. You need  have a Doppler ultrasound done to make sure that this is not a blood clot. In the meantime, you may take 800 mg of ibuprofen with 1 g of Tylenol up to 3 times a day. Have a very low threshold go to the ER-  if you start having chest pain, shortness of breath, of if your calf gets any bigger, call 911 and go there immediately.

## 2016-08-11 ENCOUNTER — Emergency Department: Payer: 59

## 2016-08-11 ENCOUNTER — Encounter: Payer: Self-pay | Admitting: Emergency Medicine

## 2016-08-11 ENCOUNTER — Emergency Department
Admission: EM | Admit: 2016-08-11 | Discharge: 2016-08-11 | Disposition: A | Discharge status: Home / Self Care | Payer: 59 | Attending: Emergency Medicine | Admitting: Emergency Medicine

## 2016-08-11 DIAGNOSIS — M7989 Other specified soft tissue disorders: Secondary | ICD-10-CM | POA: Insufficient documentation

## 2016-08-11 DIAGNOSIS — I10 Essential (primary) hypertension: Secondary | ICD-10-CM | POA: Insufficient documentation

## 2016-08-11 DIAGNOSIS — M79605 Pain in left leg: Secondary | ICD-10-CM

## 2016-08-11 DIAGNOSIS — M79662 Pain in left lower leg: Secondary | ICD-10-CM

## 2016-08-11 DIAGNOSIS — I739 Peripheral vascular disease, unspecified: Secondary | ICD-10-CM | POA: Diagnosis not present

## 2016-08-11 DIAGNOSIS — J45909 Unspecified asthma, uncomplicated: Secondary | ICD-10-CM | POA: Diagnosis not present

## 2016-08-11 NOTE — Discharge Instructions (Signed)
As we discussed, your workup today was reassuring with no sign of blood clot in your venous system and no evidence of arterial occlusion nor dissection.  Though we do not know exactly what is causing your symptoms, it appears that you have no emergent medical condition at this time and that you are safe to go home and follow up as recommended in this paperwork.  Please read through the included information about claudication as well as about routine care of injuries (RICE).  Please return immediately to the Emergency Department if you develop any new or worsening symptoms that concern you.

## 2016-08-11 NOTE — ED Notes (Signed)
Patient transported to Ultrasound 

## 2016-08-11 NOTE — ED Notes (Signed)
ED Provider at bedside. 

## 2016-08-11 NOTE — ED Notes (Signed)
Patient c/o left lower leg pain since Tuesday. Patient states that pain is worse when he is walking and climbing steps. Patient states that he has had intermittent swelling in the leg, seems to be worse when he is active.  Patient states today he started having pain going up his knee and into his thigh and tingling in his toes.

## 2016-08-11 NOTE — ED Provider Notes (Signed)
[facility] Emergency Department Provider Note  ____________________________________________   First MD Initiated Contact with Patient 08/11/16 1724     (approximate)  I have reviewed the triage vital signs and the nursing notes.   HISTORY  Chief Complaint Claudication    HPI Ronald Fry is a 55 y.o. male significant past medical history other than hypertension and possibly high triglycerides 2 presents for evaluation of left lower leg pain for about 3 days.  He did not have any specific traumatic injury of which he is aware.  At first he thought it was just a pulled muscle in his left calf but he noticed that the pain gets worse when he walks up stairs and with exertion.  Last night he noticed that his left calf was swollen in comparison to the right.  He saw someone at urgent care who recommended that he have an ultrasound to rule out DVT and he decided to wait and follow up with his primary care doctor next week, but then he had more significant pain with ambulation and he started to feel some numbness in his toes so he decided to come in for evaluation.  He denies fever/chills, chest pain, shortness of breath, nausea, vomiting, any history of trauma.  He states that rest makes the symptoms better and walking up stairs in particular makes the pain and swelling in his left leg worse.  He has no history of blood clots.  He has no history of tobacco abuse.  He has no history of coronary artery nor peripheral vascular disease.He describes symptoms as severe earlier today but currently, after lying in the emergency department bed for a couple of hours, the symptoms have resolved.   Past Medical History:  Diagnosis Date  . Allergy   . Asthma   . Hypertension     Patient Active Problem List   Diagnosis Date Noted  . Environmental and seasonal allergies 08/07/2016  . Hypotestosteronism 11/02/2015  . Intermittent palpitations 07/27/2015  . Gastroesophageal  reflux disease 07/27/2015  . Asthma, mild intermittent 07/27/2015  . Rhinitis, allergic 07/27/2015  . Vitamin D deficiency 07/27/2015  . Obesity, Class I, BMI 30-34.9 07/26/2015    Past Surgical History:  Procedure Laterality Date  . NO PAST SURGERIES    . TREATMENT FISTULA ANAL      Prior to Admission medications   Medication Sig Start Date End Date Taking? Authorizing Provider  albuterol (PROVENTIL HFA;VENTOLIN HFA) 108 (90 BASE) MCG/ACT inhaler Inhale 2 puffs into the lungs every 4 (four) hours as needed for wheezing or shortness of breath. 07/26/15   Adline Potter, MD  cetirizine (ZYRTEC) 10 MG tablet Take 10 mg by mouth daily.    Historical Provider, MD  fluticasone (FLONASE) 50 MCG/ACT nasal spray Place into both nostrils daily.    Historical Provider, MD  omeprazole (PRILOSEC) 20 MG capsule Take 1 capsule (20 mg total) by mouth daily as needed. 07/26/15   Adline Potter, MD    Allergies Patient has no known allergies.  Family History  Problem Relation Age of Onset  . Stroke Mother   . Stroke Father     Social History Social History  Substance Use Topics  . Smoking status: Never Smoker  . Smokeless tobacco: Never Used  . Alcohol use 0.0 oz/week    Review of Systems Constitutional: No fever/chills Eyes: No visual changes. ENT: No sore throat. Cardiovascular: Denies chest pain. Respiratory: Denies shortness of breath. Gastrointestinal: No abdominal pain.  No nausea, no vomiting.  No diarrhea.  No constipation. Genitourinary: Negative for dysuria. Musculoskeletal: Left lower leg pain Skin: Negative for rash. Neurological: Transient numbness in left toes while experiencing significant left calf pain and swelling within the last 24 hours (now resolved)  10-point ROS otherwise negative.  ____________________________________________   PHYSICAL EXAM:  VITAL SIGNS: ED Triage Vitals [08/11/16 1542]  Enc Vitals Group     BP (!) 142/90     Pulse Rate 70     Resp  16     Temp 97.7 F (36.5 C)     Temp Source Oral     SpO2 94 %     Weight 233 lb (105.7 kg)     Height 5\' 10"  (1.778 m)     Head Circumference      Peak Flow      Pain Score 2     Pain Loc      Pain Edu?      Excl. in [place]?     Constitutional: Alert and oriented. Well appearing and in no acute distress. Eyes: Conjunctivae are normal. PERRL. EOMI. Head: Atraumatic. Nose: No congestion/rhinnorhea. Mouth/Throat: Mucous membranes are moist.  Oropharynx non-erythematous. Neck: No stridor.  No meningeal signs.   Cardiovascular: Normal rate, regular rhythm. Good peripheral circulation. Grossly normal heart sounds. Respiratory: Normal respiratory effort.  No retractions. Lungs CTAB. Gastrointestinal: Soft and nontender. No distention.  Musculoskeletal: No lower extremity tenderness nor edema. No gross deformities of extremities.  The color is the same in both of his feet, both feet are equally warm with palpable distal pulses.  He has no tenderness to palpation throughout the left lower extremity including the calf and popliteal fossa.  Neurologic:  Normal speech and language. No gross focal neurologic deficits are appreciated even in the LLE Skin:  Skin is warm, dry and intact. No rash noted. Psychiatric: Mood and affect are normal. Speech and behavior are normal.  ____________________________________________   LABS (all labs ordered are listed, but only abnormal results are displayed)  Labs Reviewed - No data to display ____________________________________________  EKG  None - EKG not ordered by ED physician ____________________________________________  RADIOLOGY   US Venous Img Lower Unilateral Left  Result Date: 08/11/2016 CLINICAL DATA:  Left leg pain and swelling for 2 days EXAM: Left LOWER EXTREMITY VENOUS DOPPLER ULTRASOUND TECHNIQUE: Gray-scale sonography with graded compression, as well as color Doppler and duplex ultrasound were performed to evaluate the lower  extremity deep venous systems from the level of the common femoral vein and including the common femoral, femoral, profunda femoral, popliteal and calf veins including the posterior tibial, peroneal and gastrocnemius veins when visible. The superficial great saphenous vein was also interrogated. Spectral Doppler was utilized to evaluate flow at rest and with distal augmentation maneuvers in the common femoral, femoral and popliteal veins. COMPARISON:  None. FINDINGS: Contralateral Common Femoral Vein: Respiratory phasicity is normal and symmetric with the symptomatic side. No evidence of thrombus. Normal compressibility. Common Femoral Vein: No evidence of thrombus. Normal compressibility, respiratory phasicity and response to augmentation. Saphenofemoral Junction: No evidence of thrombus. Normal compressibility and flow on color Doppler imaging. Profunda Femoral Vein: No evidence of thrombus. Normal compressibility and flow on color Doppler imaging. Femoral Vein: No evidence of thrombus. Normal compressibility, respiratory phasicity and response to augmentation. Popliteal Vein: No evidence of thrombus. Normal compressibility, respiratory phasicity and response to augmentation. Calf Veins: No evidence of thrombus. Normal compressibility and flow on color Doppler imaging. Superficial Great Saphenous Vein: No evidence of thrombus. Normal  compressibility and flow on color Doppler imaging. Venous Reflux:  None. Other Findings:  None. IMPRESSION: No evidence of deep venous thrombosis. Electronically Signed   By: Inez Catalina M.D.   On: 08/11/2016 17:21    ____________________________________________   PROCEDURES  Procedure(s) performed:   Procedures   Critical Care performed: No ____________________________________________   INITIAL IMPRESSION / ASSESSMENT AND PLAN / ED COURSE  Pertinent labs & imaging results that were available during my care of the patient were reviewed by me and considered in my  medical decision making (see chart for details).  The patient's ultrasound is normal.  His physical exam is completely unremarkable at this time with no evidence of neurovascular abnormality and no tenderness to palpation.  He has a normal gait with ambulation but states that it is worse with climbing stairs.  This is suggestive of claudication but there is no evidence of an acute or emergent medical condition at this time.  We talked about the small possibility of arterial issue including an arterial occlusion or dissection but I think this is highly unlikely given that he is neurovascularly intact and I do not feel that a CT angiography iliofemoral runoff would be appropriate in this patient.  He understands and agrees.  He will follow-up as an outpatient.  We discussed how while claudication is possible he may simply have just pulled a muscle and had some swelling and possibly even an internal hematoma that is now improving.  His compartments are soft and easily compressible and nontender.  I gave my usual and customary return precautions.   He will follow up as an outpatient.   ____________________________________________  FINAL CLINICAL IMPRESSION(S) / ED DIAGNOSES  Final diagnoses:  Acute pain of left lower extremity     MEDICATIONS GIVEN DURING THIS VISIT:  Medications - No data to display   NEW OUTPATIENT MEDICATIONS STARTED DURING THIS VISIT:  New Prescriptions   No medications on file    Modified Medications   No medications on file    Discontinued Medications   No medications on file     Note:  This document was prepared using Dragon voice recognition software and may include unintentional dictation errors.    Hinda Kehr, MD 08/11/16 [phone]

## 2016-08-11 NOTE — ED Triage Notes (Signed)
Pt comes into the ED via POV per the recommendation of his MD for a possible blood clot in his left leg.  Patient states the pain is worse when ambulating and he is positive for swelling.  Patient explains the pain started 2 days ago and was seen at urgent care yesterday where they explained he needed to be checked for a clot.  Patient able to ambulate to triage room and he is in NAD at this time with even and unlabored respirations.

## 2016-08-15 ENCOUNTER — Ambulatory Visit: Payer: 59 | Admitting: Internal Medicine

## 2016-09-27 ENCOUNTER — Ambulatory Visit (INDEPENDENT_AMBULATORY_CARE_PROVIDER_SITE_OTHER): Payer: 59 | Admitting: Internal Medicine

## 2016-09-27 ENCOUNTER — Encounter: Payer: Self-pay | Admitting: Internal Medicine

## 2016-09-27 VITALS — BP 112/86 | HR 79 | Temp 97.9°F | Ht 70.0 in | Wt 228.0 lb

## 2016-09-27 DIAGNOSIS — J4520 Mild intermittent asthma, uncomplicated: Secondary | ICD-10-CM

## 2016-09-27 DIAGNOSIS — H6501 Acute serous otitis media, right ear: Secondary | ICD-10-CM | POA: Diagnosis not present

## 2016-09-27 MED ORDER — ALBUTEROL SULFATE HFA 108 (90 BASE) MCG/ACT IN AERS: 2.0000 | INHALATION_SPRAY | RESPIRATORY_TRACT | 1 refills | Status: DC | PRN

## 2016-09-27 MED ORDER — AMOXICILLIN-POT CLAVULANATE 875-125 MG PO TABS: 1.0000 | ORAL_TABLET | Freq: Two times a day (BID) | ORAL | 0 refills | Status: DC

## 2016-09-27 NOTE — Progress Notes (Signed)
Date:  09/27/2016   Name:  Ronald Fry   DOB:  12/10/1960   MRN:  [id number]   Chief Complaint: Ear Pain (Rt ear is been hurting for 2 weeks) and Sinus Problem Sinus Problem  This is a new problem. The current episode started in the past 7 days. The problem has been gradually worsening since onset. There has been no fever. Associated symptoms include coughing, ear pain and sinus pressure. Pertinent negatives include no chills, congestion, diaphoresis, shortness of breath (but mild wheezing) or sore throat. Past treatments include spray decongestants. The treatment provided mild relief.  Asthma  He complains of cough. There is no shortness of breath (but mild wheezing) or wheezing. This is a recurrent problem. The problem occurs intermittently (worse with URI). Associated symptoms include ear pain and postnasal drip. Pertinent negatives include no chest pain, fever, sore throat or trouble swallowing. His symptoms are aggravated by URI. His symptoms are alleviated by beta-agonist. He reports significant improvement on treatment. His past medical history is significant for asthma.      Review of Systems  Constitutional: Negative for chills, diaphoresis and fever.  HENT: Positive for ear pain, postnasal drip and sinus pressure. Negative for congestion, sore throat and trouble swallowing.   Eyes: Negative for visual disturbance.  Respiratory: Positive for cough. Negative for chest tightness, shortness of breath (but mild wheezing) and wheezing.   Cardiovascular: Negative for chest pain.    Patient Active Problem List   Diagnosis Date Noted  . Environmental and seasonal allergies 08/07/2016  . Hypotestosteronism 11/02/2015  . Intermittent palpitations 07/27/2015  . Gastroesophageal reflux disease 07/27/2015  . Asthma, mild intermittent 07/27/2015  . Rhinitis, allergic 07/27/2015  . Vitamin D deficiency 07/27/2015  . Obesity, Class I, BMI 30-34.9 07/26/2015    Prior to Admission  medications   Medication Sig Start Date End Date Taking? Authorizing Provider  albuterol (PROVENTIL HFA;VENTOLIN HFA) 108 (90 BASE) MCG/ACT inhaler Inhale 2 puffs into the lungs every 4 (four) hours as needed for wheezing or shortness of breath. 07/26/15  Yes Adline Potter, MD  cetirizine (ZYRTEC) 10 MG tablet Take 10 mg by mouth daily.   Yes Historical Provider, MD  fluticasone (FLONASE) 50 MCG/ACT nasal spray Place into both nostrils daily.   Yes Historical Provider, MD  omeprazole (PRILOSEC) 20 MG capsule Take 1 capsule (20 mg total) by mouth daily as needed. 07/26/15  Yes Adline Potter, MD    No Known Allergies  Past Surgical History:  Procedure Laterality Date  . NO PAST SURGERIES    . TREATMENT FISTULA ANAL      Social History  Substance Use Topics  . Smoking status: Never Smoker  . Smokeless tobacco: Never Used  . Alcohol use 0.0 oz/week     Medication list has been reviewed and updated.   Physical Exam  Constitutional: He is oriented to person, place, and time. He appears well-developed and well-nourished.  HENT:  Right Ear: External ear and ear canal normal. Tympanic membrane is erythematous and retracted. A middle ear effusion is present.  Left Ear: External ear and ear canal normal. Tympanic membrane is retracted.  No middle ear effusion.  Nose: Right sinus exhibits no maxillary sinus tenderness and no frontal sinus tenderness. Left sinus exhibits no maxillary sinus tenderness and no frontal sinus tenderness.  Mouth/Throat: Uvula is midline and mucous membranes are normal. No oral lesions. Posterior oropharyngeal erythema present. No oropharyngeal exudate.  Cardiovascular: Normal rate, regular rhythm and normal heart sounds.  Pulmonary/Chest: He has decreased breath sounds. He has no wheezes. He has no rales.  Lymphadenopathy:    He has no cervical adenopathy.  Neurological: He is alert and oriented to person, place, and time.    BP 112/86   Pulse 79   Temp 97.9  F (36.6 C)   Ht 5\' 10"  (1.778 m)   Wt 228 lb (103.4 kg)   SpO2 97%   BMI 32.71 kg/m   Assessment and Plan: 1. Right acute serous otitis media, recurrence not specified Continue Flonase and Zyrtec Nsaids for pain - amoxicillin-clavulanate (AUGMENTIN) 875-125 MG tablet; Take 1 tablet by mouth 2 (two) times daily.  Dispense: 20 tablet; Refill: 0  2. Mild intermittent asthma without complication - albuterol (PROVENTIL HFA;VENTOLIN HFA) 108 (90 Base) MCG/ACT inhaler; Inhale 2 puffs into the lungs every 4 (four) hours as needed for wheezing or shortness of breath.  Dispense: 54 g; Refill: [place], MD [facility]  09/27/2016

## 2016-12-27 ENCOUNTER — Ambulatory Visit (INDEPENDENT_AMBULATORY_CARE_PROVIDER_SITE_OTHER): Payer: 59 | Admitting: Internal Medicine

## 2016-12-27 ENCOUNTER — Encounter: Payer: Self-pay | Admitting: Internal Medicine

## 2016-12-27 VITALS — BP 104/72 | HR 74 | Ht 70.0 in | Wt 232.4 lb

## 2016-12-27 DIAGNOSIS — J301 Allergic rhinitis due to pollen: Secondary | ICD-10-CM

## 2016-12-27 DIAGNOSIS — Z23 Encounter for immunization: Secondary | ICD-10-CM | POA: Diagnosis not present

## 2016-12-27 NOTE — Progress Notes (Signed)
Date:  12/27/2016   Name:  Ronald Fry   DOB:  April 25, 1961   MRN:  [id number]   Chief Complaint: Immunizations (Pt interested in cologaurd and Shingrx vaccine. ) Vaccinations: he has had the Zostavax but would like to get Shingrix.   Colon cancer screening - had colonoscopy at age 56 in [place].  It was completely normal and 10 yr recommendation was made.  He got a call from the system about colonoscopy because it was not documented in his chart.  He is wondering if he should do a cologuard sooner than age 38.  He has no family history of colon polyp or cancer.  Alleriges - he has some nasal congestion and occasional cough triggered by pollen.  He uses zyrtec and flonase daily with good control.  Sometimes uses an inhaler.  Review of Systems  Constitutional: Negative for chills and fatigue.  Respiratory: Positive for cough and wheezing (with allergy). Negative for apnea, chest tightness and shortness of breath.   Cardiovascular: Negative for chest pain.  Gastrointestinal: Negative for abdominal pain and blood in stool.  Skin: Negative for rash.  Neurological: Negative for dizziness and headaches.  Psychiatric/Behavioral: Negative for dysphoric mood.    Patient Active Problem List   Diagnosis Date Noted  . Environmental and seasonal allergies 08/07/2016  . Hypotestosteronism 11/02/2015  . Intermittent palpitations 07/27/2015  . Gastroesophageal reflux disease 07/27/2015  . Asthma, mild intermittent 07/27/2015  . Rhinitis, allergic 07/27/2015  . Vitamin D deficiency 07/27/2015  . Obesity, Class I, BMI 30-34.9 07/26/2015    Prior to Admission medications   Medication Sig Start Date End Date Taking? Authorizing Provider  albuterol (PROVENTIL HFA;VENTOLIN HFA) 108 (90 Base) MCG/ACT inhaler Inhale 2 puffs into the lungs every 4 (four) hours as needed for wheezing or shortness of breath. 09/27/16  Yes Glean Hess, MD  cetirizine (ZYRTEC) 10 MG tablet Take 10 mg by mouth daily.    Yes Historical Provider, MD  fluticasone (FLONASE) 50 MCG/ACT nasal spray Place into both nostrils daily.   Yes Historical Provider, MD  omeprazole (PRILOSEC) 20 MG capsule Take 1 capsule (20 mg total) by mouth daily as needed. 07/26/15  Yes Adline Potter, MD    No Known Allergies  Past Surgical History:  Procedure Laterality Date  . NO PAST SURGERIES    . TREATMENT FISTULA ANAL      Social History  Substance Use Topics  . Smoking status: Never Smoker  . Smokeless tobacco: Never Used  . Alcohol use 0.0 oz/week     Medication list has been reviewed and updated.   Physical Exam  Constitutional: He is oriented to person, place, and time. He appears well-developed. No distress.  HENT:  Head: Normocephalic and atraumatic.  Neck: Carotid bruit is not present.  Cardiovascular: Normal rate, regular rhythm and normal heart sounds.   Pulmonary/Chest: Effort normal and breath sounds normal. No respiratory distress. He has no wheezes.  Musculoskeletal: He exhibits no edema or tenderness.  Neurological: He is alert and oriented to person, place, and time.  Skin: Skin is warm and dry. No rash noted.  Psychiatric: He has a normal mood and affect. His behavior is normal. Thought content normal.  Nursing note and vitals reviewed.   BP 104/72 (BP Location: Right Arm, Patient Position: Sitting, Cuff Size: Large)   Pulse 74   Ht 5\' 10"  (1.778 m)   Wt 232 lb 6.4 oz (105.4 kg)   SpO2 97%   BMI 33.35 kg/m  Assessment and Plan: 1. Seasonal allergic rhinitis due to pollen Continue flonase and zyrtec  2. Need for vaccination for zoster Rx for Shingrix given  No indication for cologuard - will get colonoscopy at age 62.   No orders of the defined types were placed in this encounter.   Halina Maidens, MD [facility]  12/27/2016

## 2016-12-27 NOTE — Patient Instructions (Signed)
Health Maintenance  Topic Date Due  . Hepatitis C Screening  05/21/1961  . HIV Screening  07/14/1974  . INFLUENZA VACCINE  03/28/2017  . COLONOSCOPY  07/28/2019  . TETANUS/TDAP  08/28/2020

## 2017-03-20 ENCOUNTER — Ambulatory Visit (INDEPENDENT_AMBULATORY_CARE_PROVIDER_SITE_OTHER): Payer: 59 | Admitting: Internal Medicine

## 2017-03-20 ENCOUNTER — Encounter: Payer: Self-pay | Admitting: Internal Medicine

## 2017-03-20 VITALS — BP 124/80 | HR 78 | Ht 70.0 in | Wt 237.0 lb

## 2017-03-20 DIAGNOSIS — J4520 Mild intermittent asthma, uncomplicated: Secondary | ICD-10-CM | POA: Diagnosis not present

## 2017-03-20 DIAGNOSIS — K219 Gastro-esophageal reflux disease without esophagitis: Secondary | ICD-10-CM

## 2017-03-20 MED ORDER — FLUTICASONE PROPIONATE HFA 110 MCG/ACT IN AERO: 2.0000 | INHALATION_SPRAY | Freq: Every day | RESPIRATORY_TRACT | 3 refills | Status: DC

## 2017-03-20 MED ORDER — ALBUTEROL SULFATE (2.5 MG/3ML) 0.083% IN NEBU: 2.5000 mg | INHALATION_SOLUTION | Freq: Four times a day (QID) | RESPIRATORY_TRACT | 1 refills | Status: DC | PRN

## 2017-03-20 NOTE — Progress Notes (Signed)
Date:  03/20/2017   Name:  Ronald Fry   DOB:  [birth year]   MRN:  [id number]   Chief Complaint: Asthma (-having allergies and asthma is feeling worse. Start coughin persistently. - using the inhaler subsides. )  Asthma  He complains of chest tightness, cough and wheezing. There is no shortness of breath. This is a recurrent problem. The current episode started more than 1 year ago. The cough is non-productive. Associated symptoms include heartburn, nasal congestion, postnasal drip and rhinorrhea. Pertinent negatives include no chest pain, fever or headaches. His symptoms are aggravated by pollen and URI. His symptoms are alleviated by beta-agonist. He reports moderate improvement on treatment. His past medical history is significant for asthma.  Gastroesophageal Reflux  He complains of coughing, heartburn and wheezing. He reports no abdominal pain, no chest pain or no choking. This is a recurrent problem. The problem occurs occasionally. Pertinent negatives include no fatigue. He has tried a PPI and an antacid for the symptoms. The treatment provided significant (but stopped omeprazole over concern for side effects) relief.     Review of Systems  Constitutional: Negative for chills, fatigue, fever and unexpected weight change.  HENT: Positive for postnasal drip and rhinorrhea.   Eyes: Negative for visual disturbance.  Respiratory: Positive for cough and wheezing. Negative for choking, chest tightness and shortness of breath.   Cardiovascular: Negative for chest pain and palpitations.  Gastrointestinal: Positive for heartburn. Negative for abdominal pain, blood in stool, constipation and diarrhea.       Occasional reflux  Skin: Negative for rash.  Allergic/Immunologic: Positive for environmental allergies.  Neurological: Negative for dizziness and headaches.    Patient Active Problem List   Diagnosis Date Noted  . Environmental and seasonal allergies 08/07/2016  .  Hypotestosteronism 11/02/2015  . Intermittent palpitations 07/27/2015  . Gastroesophageal reflux disease 07/27/2015  . Asthma, mild intermittent 07/27/2015  . Rhinitis, allergic 07/27/2015  . Vitamin D deficiency 07/27/2015  . Obesity, Class I, BMI 30-34.9 07/26/2015    Prior to Admission medications   Medication Sig Start Date End Date Taking? Authorizing Provider  albuterol (PROVENTIL HFA;VENTOLIN HFA) 108 (90 Base) MCG/ACT inhaler Inhale 2 puffs into the lungs every 4 (four) hours as needed for wheezing or shortness of breath. 09/27/16  Yes Glean Hess, MD  cetirizine (ZYRTEC) 10 MG tablet Take 10 mg by mouth daily.   Yes [provider]  fluticasone (FLONASE) 50 MCG/ACT nasal spray Place into both nostrils daily.   Yes [provider]  omeprazole (PRILOSEC) 20 MG capsule Take 1 capsule (20 mg total) by mouth daily as needed. 07/26/15  Yes [place]on[place], Gwyndolyn Saxon, MD    No Known Allergies  Past Surgical History:  Procedure Laterality Date  . NO PAST SURGERIES    . TREATMENT FISTULA ANAL      Social History  Substance Use Topics  . Smoking status: Never Smoker  . Smokeless tobacco: Never Used  . Alcohol use 0.0 oz/week     Medication list has been reviewed and updated.   Physical Exam  Constitutional: He is oriented to person, place, and time. He appears well-developed. No distress.  HENT:  Head: Normocephalic and atraumatic.  Neck: Carotid bruit is not present.  Cardiovascular: Normal rate, regular rhythm and normal heart sounds.   Pulmonary/Chest: Effort normal and breath sounds normal. No respiratory distress. He has no wheezes.  Musculoskeletal: Normal range of motion.  Neurological: He is alert and oriented to person, place, and time.  Skin:  Skin is warm and dry. No rash noted.  Psychiatric: He has a normal mood and affect. His behavior is normal. Thought content normal.  Nursing note and vitals reviewed.   BP 124/80   Pulse 78   Ht 5\' 10"   (1.778 m)   Wt 237 lb (107.5 kg)   SpO2 97%   BMI 34.01 kg/m   Assessment and Plan: 1. Mild intermittent asthma without complication Begin flovent MDI daily Continue albuterol as needed - albuterol (PROVENTIL) (2.5 MG/3ML) 0.083% nebulizer solution; Take 3 mLs (2.5 mg total) by nebulization every 6 (six) hours as needed for wheezing or shortness of breath.  Dispense: 150 mL; Refill: 1 - fluticasone (FLOVENT HFA) 110 MCG/ACT inhaler; Inhale 2 puffs into the lungs daily.  Dispense: 3 Inhaler; Refill: 3  2. Gastroesophageal reflux disease, esophagitis presence not specified Stopped omeprazole over side effect concerns Recommend Zantac 150 mg daily PRN instead   Meds ordered this encounter  Medications  . albuterol (PROVENTIL) (2.5 MG/3ML) 0.083% nebulizer solution    Sig: Take 3 mLs (2.5 mg total) by nebulization every 6 (six) hours as needed for wheezing or shortness of breath.    Dispense:  150 mL    Refill:  1  . fluticasone (FLOVENT HFA) 110 MCG/ACT inhaler    Sig: Inhale 2 puffs into the lungs daily.    Dispense:  3 Inhaler    Refill:  [place], MD [facility]  03/20/2017

## 2017-03-20 NOTE — Patient Instructions (Signed)
Zantac (ranitidine) 150 mg daily as needed for reflux

## 2017-07-02 ENCOUNTER — Ambulatory Visit (INDEPENDENT_AMBULATORY_CARE_PROVIDER_SITE_OTHER): Payer: 59 | Admitting: Internal Medicine

## 2017-07-02 ENCOUNTER — Encounter: Payer: Self-pay | Admitting: Internal Medicine

## 2017-07-02 VITALS — BP 116/62 | HR 73 | Ht 70.0 in | Wt 233.0 lb

## 2017-07-02 DIAGNOSIS — R002 Palpitations: Secondary | ICD-10-CM | POA: Diagnosis not present

## 2017-07-02 NOTE — Progress Notes (Signed)
Date:  07/02/2017   Name:  Ronald Fry   DOB:  [birth year]   MRN:  [id number]   Chief Complaint: Panic Attack (Panick attacks started aboout a week ago. Feels like nothing really triggered it to start. Having to take a lot of deep breathes because heart starts racing when having a attack. Gad7- 6)  Last week had an episode during a meeting where he felt very angry over a comment made.  He did not have any palpitations or shortness of breath.  Three days ago, had sinus congestion - took tylenol and sudafed which made him feel depressed.  The sx passed and he was fine overnight.  Yesterday he got irritated with his dogs and his heart started racing and he felt tense.  He did not take any more sudafed or use albuterol.  He did take a lisinopril last night but did not take his BP.   Anxiety  Presents for initial visit. Symptoms include irritability, nervous/anxious behavior and palpitations. Patient reports no chest pain, dizziness or shortness of breath.    Palpitations   This is a new problem. The current episode started yesterday. The problem has been resolved. Associated symptoms include anxiety. Pertinent negatives include no chest pain, diaphoresis, dizziness, fever, irregular heartbeat, near-syncope, numbness, shortness of breath or weakness. He has tried deep relaxation for the symptoms.     Review of Systems  Constitutional: Positive for irritability. Negative for diaphoresis and fever.  HENT: Negative for trouble swallowing.   Eyes: Negative for visual disturbance.  Respiratory: Negative for chest tightness, shortness of breath and wheezing.   Cardiovascular: Positive for palpitations. Negative for chest pain, leg swelling and near-syncope.  Gastrointestinal: Negative for abdominal pain.  Musculoskeletal: Negative for back pain.  Skin: Negative for rash.  Neurological: Positive for light-headedness. Negative for dizziness, tremors, syncope, speech difficulty, weakness,  numbness and headaches.  Psychiatric/Behavioral: Positive for agitation. The patient is nervous/anxious.     Patient Active Problem List   Diagnosis Date Noted  . Environmental and seasonal allergies 08/07/2016  . Hypotestosteronism 11/02/2015  . Intermittent palpitations 07/27/2015  . Gastroesophageal reflux disease 07/27/2015  . Asthma, mild intermittent 07/27/2015  . Rhinitis, allergic 07/27/2015  . Vitamin D deficiency 07/27/2015  . Obesity, Class I, BMI 30-34.9 07/26/2015    Prior to Admission medications   Medication Sig Start Date End Date Taking? Authorizing Provider  albuterol (PROVENTIL HFA;VENTOLIN HFA) 108 (90 Base) MCG/ACT inhaler Inhale 2 puffs into the lungs every 4 (four) hours as needed for wheezing or shortness of breath. 09/27/16  Yes Glean Hess, MD  albuterol (PROVENTIL) (2.5 MG/3ML) 0.083% nebulizer solution Take 3 mLs (2.5 mg total) by nebulization every 6 (six) hours as needed for wheezing or shortness of breath. 03/20/17  Yes Glean Hess, MD  cetirizine (ZYRTEC) 10 MG tablet Take 10 mg by mouth daily.   Yes [provider]  fluticasone (FLONASE) 50 MCG/ACT nasal spray Place into both nostrils daily.   Yes [provider]  fluticasone (FLOVENT HFA) 110 MCG/ACT inhaler Inhale 2 puffs into the lungs daily. 03/20/17  Yes Glean Hess, MD    No Known Allergies  Past Surgical History:  Procedure Laterality Date  . NO PAST SURGERIES    . TREATMENT FISTULA ANAL      Social History   Tobacco Use  . Smoking status: Never Smoker  . Smokeless tobacco: Never Used  Substance Use Topics  . Alcohol use: Yes    Alcohol/week: 0.0 oz  .  Drug use: No     Medication list has been reviewed and updated.  PHQ 2/9 Scores 08/07/2016 12/01/2015  PHQ - 2 Score 0 0    Physical Exam  Constitutional: He is oriented to person, place, and time. He appears well-developed. No distress.  HENT:  Head: Normocephalic and atraumatic.  Neck: Normal  range of motion. Neck supple. No thyromegaly present.  Cardiovascular: Normal rate, regular rhythm and normal heart sounds.  No murmur heard. Pulmonary/Chest: Effort normal. No respiratory distress. He has no wheezes. He has no rales.  Abdominal: Soft. Bowel sounds are normal. He exhibits no distension. There is no tenderness.  Musculoskeletal: Normal range of motion. He exhibits no edema.  Neurological: He is alert and oriented to person, place, and time.  Skin: Skin is warm and dry. No rash noted.  Psychiatric: He has a normal mood and affect. His behavior is normal. Thought content normal.  Nursing note and vitals reviewed.   BP 116/62   Pulse 73   Ht 5\' 10"  (1.778 m)   Wt 233 lb (105.7 kg)   SpO2 96%   BMI 33.43 kg/m   Assessment and Plan: 1. Palpitations Consider ZIO patch - pt prefers to wait to see if sx return (he [place]) - EKG 12-Lead - SR @ 68. Left axis with ant fasiciular block - CBC with Differential/Platelet - Comprehensive metabolic panel - Magnesium - TSH   No orders of the defined types were placed in this encounter.   Partially dictated using Editor, commissioning. Any errors are unintentional.  Halina Maidens, MD [facility]  07/02/2017

## 2017-07-03 LAB — CBC WITH DIFFERENTIAL/PLATELET
Basophils Absolute: 0 10*3/uL (ref 0.0–0.2)
Basos: 0 %
EOS (ABSOLUTE): 0.1 10*3/uL (ref 0.0–0.4)
EOS: 1 %
HEMATOCRIT: 47.2 % (ref 37.5–51.0)
HEMOGLOBIN: 16.7 g/dL (ref 13.0–17.7)
IMMATURE GRANS (ABS): 0 10*3/uL (ref 0.0–0.1)
IMMATURE GRANULOCYTES: 1 %
Lymphocytes Absolute: 2.4 10*3/uL (ref 0.7–3.1)
Lymphs: 29 %
MCH: 30.8 pg (ref 26.6–33.0)
MCHC: 35.4 g/dL (ref 31.5–35.7)
MCV: 87 fL (ref 79–97)
MONOCYTES: 7 %
Monocytes Absolute: 0.5 10*3/uL (ref 0.1–0.9)
NEUTROS PCT: 62 %
Neutrophils Absolute: 5.2 10*3/uL (ref 1.4–7.0)
Platelets: 218 10*3/uL (ref 150–379)
RBC: 5.43 x10E6/uL (ref 4.14–5.80)
RDW: 13.9 % (ref 12.3–15.4)
WBC: 8.2 10*3/uL (ref 3.4–10.8)

## 2017-07-03 LAB — COMPREHENSIVE METABOLIC PANEL
ALBUMIN: 4.5 g/dL (ref 3.5–5.5)
ALT: 22 IU/L (ref 0–44)
AST: 16 IU/L (ref 0–40)
Albumin/Globulin Ratio: 2.5 — ABNORMAL HIGH (ref 1.2–2.2)
Alkaline Phosphatase: 67 IU/L (ref 39–117)
BUN / CREAT RATIO: 12 (ref 9–20)
BUN: 13 mg/dL (ref 6–24)
Bilirubin Total: 0.6 mg/dL (ref 0.0–1.2)
CALCIUM: 9.3 mg/dL (ref 8.7–10.2)
CO2: 23 mmol/L (ref 20–29)
CREATININE: 1.11 mg/dL (ref 0.76–1.27)
Chloride: 100 mmol/L (ref 96–106)
GFR calc Af Amer: 85 mL/min/{1.73_m2} (ref >59)
GFR, EST NON AFRICAN AMERICAN: 73 mL/min/{1.73_m2} (ref >59)
GLOBULIN, TOTAL: 1.8 g/dL (ref 1.5–4.5)
Glucose: 111 mg/dL — ABNORMAL HIGH (ref 65–99)
Potassium: 4.3 mmol/L (ref 3.5–5.2)
SODIUM: 140 mmol/L (ref 134–144)
Total Protein: 6.3 g/dL (ref 6.0–8.5)

## 2017-07-03 LAB — TSH: TSH: 2.06 u[IU]/mL (ref 0.450–4.500)

## 2017-07-03 LAB — MAGNESIUM: Magnesium: 2.3 mg/dL (ref 1.6–2.3)

## 2017-10-02 ENCOUNTER — Encounter: Payer: Self-pay | Admitting: Internal Medicine

## 2017-10-02 ENCOUNTER — Ambulatory Visit (INDEPENDENT_AMBULATORY_CARE_PROVIDER_SITE_OTHER): Payer: 59 | Admitting: Internal Medicine

## 2017-10-02 VITALS — BP 118/78 | HR 78 | Ht 70.0 in | Wt 240.0 lb

## 2017-10-02 DIAGNOSIS — J4520 Mild intermittent asthma, uncomplicated: Secondary | ICD-10-CM | POA: Diagnosis not present

## 2017-10-02 DIAGNOSIS — K219 Gastro-esophageal reflux disease without esophagitis: Secondary | ICD-10-CM

## 2017-10-02 DIAGNOSIS — J3089 Other allergic rhinitis: Secondary | ICD-10-CM

## 2017-10-02 DIAGNOSIS — Z0001 Encounter for general adult medical examination with abnormal findings: Secondary | ICD-10-CM | POA: Diagnosis not present

## 2017-10-02 DIAGNOSIS — Z125 Encounter for screening for malignant neoplasm of prostate: Secondary | ICD-10-CM

## 2017-10-02 DIAGNOSIS — Z1159 Encounter for screening for other viral diseases: Secondary | ICD-10-CM

## 2017-10-02 DIAGNOSIS — Z0000 Encounter for general adult medical examination without abnormal findings: Secondary | ICD-10-CM

## 2017-10-02 LAB — POCT URINALYSIS DIPSTICK
BILIRUBIN UA: NEGATIVE
Blood, UA: NEGATIVE
Glucose, UA: NEGATIVE
Ketones, UA: NEGATIVE
Leukocytes, UA: NEGATIVE
Nitrite, UA: NEGATIVE
PH UA: 6 (ref 5.0–8.0)
Protein, UA: NEGATIVE
Spec Grav, UA: 1.02 (ref 1.010–1.025)
UROBILINOGEN UA: 0.2 U/dL

## 2017-10-02 NOTE — Patient Instructions (Addendum)
Recommend annual Skin survey exam. Health Maintenance, Male A healthy lifestyle and preventive care is important for your health and wellness. Ask your health care provider about what schedule of regular examinations is right for you. What should I know about weight and diet? Eat a Healthy Diet  Eat plenty of vegetables, fruits, whole grains, low-fat dairy products, and lean protein.  Do not eat a lot of foods high in solid fats, added sugars, or salt.  Maintain a Healthy Weight Regular exercise can help you achieve or maintain a healthy weight. You should:  Do at least 150 minutes of exercise each week. The exercise should increase your heart rate and make you sweat (moderate-intensity exercise).  Do strength-training exercises at least twice a week.  Watch Your Levels of Cholesterol and Blood Lipids  Have your blood tested for lipids and cholesterol every 5 years starting at 59 years of age. If you are at high risk for heart disease, you should start having your blood tested when you are 59 years old. You may need to have your cholesterol levels checked more often if: ? Your lipid or cholesterol levels are high. ? You are older than 59 years of age. ? You are at high risk for heart disease.  What should I know about cancer screening? Many types of cancers can be detected early and may often be prevented. Lung Cancer  You should be screened every year for lung cancer if: ? You are a current smoker who has smoked for at least 30 years. ? You are a former smoker who has quit within the past 15 years.  Talk to your health care provider about your screening options, when you should start screening, and how often you should be screened.  Colorectal Cancer  Routine colorectal cancer screening usually begins at 59 years of age and should be repeated every 5-10 years until you are 59 years old. You may need to be screened more often if early forms of precancerous polyps or small growths  are found. Your health care provider may recommend screening at an earlier age if you have risk factors for colon cancer.  Your health care provider may recommend using home test kits to check for hidden blood in the stool.  A small camera at the end of a tube can be used to examine your colon (sigmoidoscopy or colonoscopy). This checks for the earliest forms of colorectal cancer.  Prostate and Testicular Cancer  Depending on your age and overall health, your health care provider may do certain tests to screen for prostate and testicular cancer.  Talk to your health care provider about any symptoms or concerns you have about testicular or prostate cancer.  Skin Cancer  Check your skin from head to toe regularly.  Tell your health care provider about any new moles or changes in moles, especially if: ? There is a change in a mole's size, shape, or color. ? You have a mole that is larger than a pencil eraser.  Always use sunscreen. Apply sunscreen liberally and repeat throughout the day.  Protect yourself by wearing long sleeves, pants, a wide-brimmed hat, and sunglasses when outside.  What should I know about heart disease, diabetes, and high blood pressure?  If you are 68-25 years of age, have your blood pressure checked every 3-5 years. If you are 58 years of age or older, have your blood pressure checked every year. You should have your blood pressure measured twice-once when you are at a hospital  or clinic, and once when you are not at a hospital or clinic. Record the average of the two measurements. To check your blood pressure when you are not at a hospital or clinic, you can use: ? An automated blood pressure machine at a pharmacy. ? A home blood pressure monitor.  Talk to your health care provider about your target blood pressure.  If you are between 29-50 years old, ask your health care provider if you should take aspirin to prevent heart disease.  Have regular diabetes  screenings by checking your fasting blood sugar level. ? If you are at a normal weight and have a low risk for diabetes, have this test once every three years after the age of 35. ? If you are overweight and have a high risk for diabetes, consider being tested at a younger age or more often.  A one-time screening for abdominal aortic aneurysm (AAA) by ultrasound is recommended for men aged 80-75 years who are current or former smokers. What should I know about preventing infection? Hepatitis B If you have a higher risk for hepatitis B, you should be screened for this virus. Talk with your health care provider to find out if you are at risk for hepatitis B infection. Hepatitis C Blood testing is recommended for:  Everyone born from [birth year] through 1967.  Anyone with known risk factors for hepatitis C.  Sexually Transmitted Diseases (STDs)  You should be screened each year for STDs including gonorrhea and chlamydia if: ? You are sexually active and are younger than 59 years of age. ? You are older than 59 years of age and your health care provider tells you that you are at risk for this type of infection. ? Your sexual activity has changed since you were last screened and you are at an increased risk for chlamydia or gonorrhea. Ask your health care provider if you are at risk.  Talk with your health care provider about whether you are at high risk of being infected with HIV. Your health care provider may recommend a prescription medicine to help prevent HIV infection.  What else can I do?  Schedule regular health, dental, and eye exams.  Stay current with your vaccines (immunizations).  Do not use any tobacco products, such as cigarettes, chewing tobacco, and e-cigarettes. If you need help quitting, ask your health care provider.  Limit alcohol intake to no more than 2 drinks per day. One drink equals 12 ounces of beer, 5 ounces of wine, or 1 ounces of hard liquor.  Do not use street  drugs.  Do not share needles.  Ask your health care provider for help if you need support or information about quitting drugs.  Tell your health care provider if you often feel depressed.  Tell your health care provider if you have ever been abused or do not feel safe at home. This information is not intended to replace advice given to you by your health care provider. Make sure you discuss any questions you have with your health care provider. Document Released: 02/10/2008 Document Revised: 04/12/2016 Document Reviewed: 05/18/2015 Elsevier Interactive Patient Education  [employer].

## 2017-10-02 NOTE — Progress Notes (Signed)
Date:  10/02/2017   Name:  Ronald Fry   DOB:  10-17-1960   MRN:  [id number]   Chief Complaint: Annual Exam Ronald Fry is a 57 y.o. male who presents today for his Complete Annual Exam. He feels well. He reports exercising very little. He reports he is sleeping well.   Asthma  He complains of wheezing. There is no shortness of breath. This is a recurrent problem. The problem occurs rarely. Associated symptoms include heartburn. Pertinent negatives include no chest pain, ear pain, fever, headaches, postnasal drip or trouble swallowing. Ineffective treatments: he has had the first pneumonia vaccination.  Gastroesophageal Reflux  He complains of heartburn and wheezing. He reports no abdominal pain or no chest pain. This is a recurrent problem. The problem occurs occasionally. The symptoms are aggravated by certain foods and lying down. Pertinent negatives include no fatigue. He has tried a PPI for the symptoms.  Allergic rhinnitis - he allergies that are fairly well controlled on current medications.  Intermittent worsening with pollen or dust.  Mild sinus congestion, drip and sneezing at times.    Review of Systems  Constitutional: Negative for chills, fatigue and fever.  HENT: Negative for ear pain, postnasal drip, sinus pain and trouble swallowing.   Eyes: Negative for visual disturbance.  Respiratory: Positive for wheezing. Negative for apnea, chest tightness and shortness of breath.   Cardiovascular: Negative for chest pain, palpitations and leg swelling.  Gastrointestinal: Positive for heartburn. Negative for abdominal pain, blood in stool and constipation.  Genitourinary: Positive for frequency. Negative for hematuria and urgency.       Nocturia x 2  Musculoskeletal: Negative for arthralgias.  Allergic/Immunologic: Positive for environmental allergies. Negative for food allergies.  Neurological: Negative for dizziness, light-headedness and headaches.    Psychiatric/Behavioral: Negative for dysphoric mood and sleep disturbance.    Patient Active Problem List   Diagnosis Date Noted  . Environmental and seasonal allergies 08/07/2016  . Hypotestosteronism 11/02/2015  . Intermittent palpitations 07/27/2015  . Gastroesophageal reflux disease 07/27/2015  . Asthma, mild intermittent 07/27/2015  . Rhinitis, allergic 07/27/2015  . Vitamin D deficiency 07/27/2015  . Obesity, Class I, BMI 30-34.9 07/26/2015    Prior to Admission medications   Medication Sig Start Date End Date Taking? Authorizing Provider  albuterol (PROVENTIL HFA;VENTOLIN HFA) 108 (90 Base) MCG/ACT inhaler Inhale 2 puffs into the lungs every 4 (four) hours as needed for wheezing or shortness of breath. 09/27/16  Yes Glean Hess, MD  albuterol (PROVENTIL) (2.5 MG/3ML) 0.083% nebulizer solution Take 3 mLs (2.5 mg total) by nebulization every 6 (six) hours as needed for wheezing or shortness of breath. 03/20/17  Yes Glean Hess, MD  cetirizine (ZYRTEC) 10 MG tablet Take 10 mg by mouth daily.   Yes [provider]  fluticasone (FLONASE) 50 MCG/ACT nasal spray Place into both nostrils daily.   Yes [provider]    No Known Allergies  Past Surgical History:  Procedure Laterality Date  . NO PAST SURGERIES    . TREATMENT FISTULA ANAL      Social History   Tobacco Use  . Smoking status: Never Smoker  . Smokeless tobacco: Never Used  Substance Use Topics  . Alcohol use: Yes    Alcohol/week: 0.0 oz  . Drug use: No     Medication list has been reviewed and updated.  PHQ 2/9 Scores 10/02/2017 08/07/2016 12/01/2015  PHQ - 2 Score 0 0 0    Physical Exam  Constitutional: He  is oriented to person, place, and time. He appears well-developed and well-nourished.  HENT:  Head: Normocephalic.  Right Ear: Tympanic membrane, external ear and ear canal normal.  Left Ear: Tympanic membrane, external ear and ear canal normal.  Nose: Nose normal.   Mouth/Throat: Uvula is midline and oropharynx is clear and moist.  Eyes: Conjunctivae and EOM are normal. Pupils are equal, round, and reactive to light.  bruising around right eye - hit by his puppy while playing  Neck: Normal range of motion. Neck supple. Carotid bruit is not present. No thyromegaly present.  Cardiovascular: Normal rate, regular rhythm, normal heart sounds and intact distal pulses.  Pulmonary/Chest: Effort normal and breath sounds normal. He has no wheezes. Right breast exhibits no mass. Left breast exhibits no mass.  Abdominal: Soft. Normal appearance and bowel sounds are normal. There is no hepatosplenomegaly. There is no tenderness.  Musculoskeletal: Normal range of motion.  Lymphadenopathy:    He has no cervical adenopathy.  Neurological: He is alert and oriented to person, place, and time. He has normal reflexes.  Skin: Skin is warm, dry and intact.  Psychiatric: He has a normal mood and affect. His speech is normal and behavior is normal. Judgment and thought content normal.  Nursing note and vitals reviewed.   BP 118/78   Pulse 78   Ht 5\' 10"  (1.778 m)   Wt 240 lb (108.9 kg)   SpO2 97%   BMI 34.44 kg/m   Assessment and Plan: 1. Annual physical exam Recommend regular exercise and healthy diet - Comprehensive metabolic panel - Lipid panel - POCT urinalysis dipstick  2. Prostate cancer screening DRE deferred - PSA  3. Mild intermittent asthma without complication Continue albuterol PRN  4. Gastroesophageal reflux disease, esophagitis presence not specified Stable on no medications at this time Recommend zantac or TUMS if needed - CBC with Differential/Platelet  5. Environmental and seasonal allergies Continue current therapy  6. Need for hepatitis C screening test - Hepatitis C antibody   No orders of the defined types were placed in this encounter.   Partially dictated using Editor, commissioning. Any errors are unintentional.  Halina Maidens, MD [facility]  10/02/2017

## 2017-10-05 DIAGNOSIS — Z125 Encounter for screening for malignant neoplasm of prostate: Secondary | ICD-10-CM | POA: Diagnosis not present

## 2017-10-05 DIAGNOSIS — K219 Gastro-esophageal reflux disease without esophagitis: Secondary | ICD-10-CM | POA: Diagnosis not present

## 2017-10-05 DIAGNOSIS — Z0000 Encounter for general adult medical examination without abnormal findings: Secondary | ICD-10-CM | POA: Diagnosis not present

## 2017-10-05 DIAGNOSIS — Z1159 Encounter for screening for other viral diseases: Secondary | ICD-10-CM | POA: Diagnosis not present

## 2017-10-06 LAB — COMPREHENSIVE METABOLIC PANEL
A/G RATIO: 2.1 (ref 1.2–2.2)
ALT: 25 IU/L (ref 0–44)
AST: 15 IU/L (ref 0–40)
Albumin: 4.4 g/dL (ref 3.5–5.5)
Alkaline Phosphatase: 70 IU/L (ref 39–117)
BUN / CREAT RATIO: 12 (ref 9–20)
BUN: 12 mg/dL (ref 6–24)
Bilirubin Total: 0.7 mg/dL (ref 0.0–1.2)
CALCIUM: 9.1 mg/dL (ref 8.7–10.2)
CO2: 23 mmol/L (ref 20–29)
Chloride: 103 mmol/L (ref 96–106)
Creatinine, Ser: 0.97 mg/dL (ref 0.76–1.27)
GFR calc Af Amer: 99 mL/min/{1.73_m2} (ref >59)
GFR, EST NON AFRICAN AMERICAN: 86 mL/min/{1.73_m2} (ref >59)
Globulin, Total: 2.1 g/dL (ref 1.5–4.5)
Glucose: 113 mg/dL — ABNORMAL HIGH (ref 65–99)
POTASSIUM: 4.7 mmol/L (ref 3.5–5.2)
Sodium: 144 mmol/L (ref 134–144)
TOTAL PROTEIN: 6.5 g/dL (ref 6.0–8.5)

## 2017-10-06 LAB — CBC WITH DIFFERENTIAL/PLATELET
BASOS: 1 %
Basophils Absolute: 0 10*3/uL (ref 0.0–0.2)
EOS (ABSOLUTE): 0.1 10*3/uL (ref 0.0–0.4)
EOS: 1 %
HEMATOCRIT: 45.6 % (ref 37.5–51.0)
Hemoglobin: 16.2 g/dL (ref 13.0–17.7)
IMMATURE GRANS (ABS): 0 10*3/uL (ref 0.0–0.1)
Immature Granulocytes: 1 %
LYMPHS: 29 %
Lymphocytes Absolute: 2.3 10*3/uL (ref 0.7–3.1)
MCH: 30.7 pg (ref 26.6–33.0)
MCHC: 35.5 g/dL (ref 31.5–35.7)
MCV: 87 fL (ref 79–97)
MONOS ABS: 0.5 10*3/uL (ref 0.1–0.9)
Monocytes: 7 %
NEUTROS ABS: 5 10*3/uL (ref 1.4–7.0)
NEUTROS PCT: 61 %
Platelets: 208 10*3/uL (ref 150–379)
RBC: 5.27 x10E6/uL (ref 4.14–5.80)
RDW: 13.6 % (ref 12.3–15.4)
WBC: 7.9 10*3/uL (ref 3.4–10.8)

## 2017-10-06 LAB — LIPID PANEL
CHOL/HDL RATIO: 4.8 ratio (ref 0.0–5.0)
Cholesterol, Total: 177 mg/dL (ref 100–199)
HDL: 37 mg/dL — ABNORMAL LOW (ref >39)
LDL CALC: 107 mg/dL — AB (ref 0–99)
Triglycerides: 163 mg/dL — ABNORMAL HIGH (ref 0–149)
VLDL Cholesterol Cal: 33 mg/dL (ref 5–40)

## 2017-10-06 LAB — HEPATITIS C ANTIBODY: Hep C Virus Ab: <0.1 s/co ratio (ref 0.0–0.9)

## 2017-10-06 LAB — PSA: Prostate Specific Ag, Serum: 2.5 ng/mL (ref 0.0–4.0)

## 2017-10-07 ENCOUNTER — Encounter: Payer: Self-pay | Admitting: Internal Medicine

## 2017-10-07 DIAGNOSIS — E785 Hyperlipidemia, unspecified: Secondary | ICD-10-CM | POA: Insufficient documentation

## 2017-10-08 NOTE — Progress Notes (Signed)
Patient agrees to trying cholesterol lowering medication. Would like sent to [place]. Sent to front to schedule 6 month recheck.

## 2017-10-09 ENCOUNTER — Other Ambulatory Visit: Payer: Self-pay | Admitting: Internal Medicine

## 2017-10-09 DIAGNOSIS — E785 Hyperlipidemia, unspecified: Secondary | ICD-10-CM

## 2017-10-09 MED ORDER — ATORVASTATIN CALCIUM 10 MG PO TABS: 10.0000 mg | ORAL_TABLET | Freq: Every day | ORAL | 1 refills | Status: DC

## 2018-06-04 DIAGNOSIS — L821 Other seborrheic keratosis: Secondary | ICD-10-CM | POA: Diagnosis not present

## 2018-06-04 DIAGNOSIS — L578 Other skin changes due to chronic exposure to nonionizing radiation: Secondary | ICD-10-CM | POA: Diagnosis not present

## 2018-06-04 DIAGNOSIS — L814 Other melanin hyperpigmentation: Secondary | ICD-10-CM | POA: Diagnosis not present

## 2018-09-17 ENCOUNTER — Encounter: Payer: Self-pay | Admitting: Internal Medicine

## 2018-09-17 ENCOUNTER — Ambulatory Visit
Admission: RE | Admit: 2018-09-17 | Discharge: 2018-09-17 | Disposition: A | Discharge status: Home / Self Care | Payer: 59 | Attending: Internal Medicine | Admitting: Internal Medicine

## 2018-09-17 ENCOUNTER — Ambulatory Visit: Payer: 59 | Admitting: Internal Medicine

## 2018-09-17 ENCOUNTER — Ambulatory Visit
Admission: RE | Admit: 2018-09-17 | Discharge: 2018-09-17 | Disposition: A | Discharge status: Home / Self Care | Payer: 59 | Source: Ambulatory Visit | Attending: Internal Medicine | Admitting: Internal Medicine

## 2018-09-17 VITALS — BP 124/80 | HR 71 | Ht 70.0 in | Wt 218.0 lb

## 2018-09-17 DIAGNOSIS — M25571 Pain in right ankle and joints of right foot: Secondary | ICD-10-CM

## 2018-09-17 DIAGNOSIS — G8929 Other chronic pain: Secondary | ICD-10-CM | POA: Diagnosis not present

## 2018-09-17 DIAGNOSIS — N529 Male erectile dysfunction, unspecified: Secondary | ICD-10-CM

## 2018-09-17 DIAGNOSIS — F322 Major depressive disorder, single episode, severe without psychotic features: Secondary | ICD-10-CM

## 2018-09-17 DIAGNOSIS — F324 Major depressive disorder, single episode, in partial remission: Secondary | ICD-10-CM | POA: Insufficient documentation

## 2018-09-17 MED ORDER — ESCITALOPRAM OXALATE 10 MG PO TABS: 10.0000 mg | ORAL_TABLET | Freq: Every day | ORAL | 1 refills | Status: DC

## 2018-09-17 MED ORDER — SILDENAFIL CITRATE 50 MG PO TABS: 50.0000 mg | ORAL_TABLET | Freq: Every day | ORAL | 0 refills | Status: DC | PRN

## 2018-09-17 NOTE — Patient Instructions (Signed)
Suicidal Feelings: How to Help Yourself °Suicide is when you end your own life. There are many things you can do to help yourself feel better when struggling with these feelings. Many services and people are available to support you and others who struggle with similar feelings.  °If you ever feel like you may hurt yourself or others, or have thoughts about taking your own life, get help right away. To get help: °· Call your local emergency services (911 in the [place] °· Go to your nearest emergency department. °· Call a suicide hotline to speak with a trained counselor. The following suicide hotlines are available in the [place]: °? 1-800-273-TALK (1-800-273-8255). °? 1-800-SUICIDE (1-800-784-2433). °? 1-888-628-9454. This is a hotline for Spanish speakers. °? 1-800-799-4889. This is a hotline for TTY users. °? 1-866-4-U-TREVOR (1-866-488-7386). This is a hotline for lesbian, gay, bisexual, transgender, or questioning youth. °? For a list of hotlines in [place], visit www.suicide.org/hotlines/international/[place]-suicide-hotlines.html °· Contact a crisis center or a local suicide prevention center. To find a crisis center or suicide prevention center: °? Call your local hospital, clinic, community service organization, mental health center, social service provider, or health department. Ask for help with connecting to a crisis center. °? For a list of crisis centers in the [place], visit: suicidepreventionlifeline.org °? For a list of crisis centers in [place], visit: suicideprevention.ca °How to help yourself feel better ° °· Promise yourself that you will not do anything extreme when you have suicidal feelings. Remember, there is hope. Many people have gotten through suicidal thoughts and feelings, and you can too. If you have had these feelings before, remind yourself that you can get through them again. °· Let family, friends, teachers, or counselors know how you are feeling. Try not to separate  yourself from those who care about you and want to help you. Talk with someone every day, even if you do not feel sociable. Face-to-face conversation is best to help them understand your feelings. °· Contact a mental health care provider and work with this person regularly. °· Make a safety plan that you can follow during a crisis. Include phone numbers of suicide prevention hotlines, mental health professionals, and trusted friends and family members you can call during an emergency. Save these numbers on your phone. °· If you are thinking of taking a lot of medicine, give your medicine to someone who can give it to you as prescribed. If you are on antidepressants and are concerned you will overdose, tell your health care provider so that he or she can give you safer medicines. °· Try to stick to your routines. Follow a schedule every day. Make self-care a priority. °· Make a list of realistic goals, and cross them off when you achieve them. Accomplishments can give you a sense of worth. °· Wait until you are feeling better before doing things that you find difficult or unpleasant. °· Do things that you have always enjoyed to take your mind off your feelings. Try reading a book, or listening to or playing music. Spending time outside, in nature, may help you feel better. °Follow these instructions at home: ° °· Visit your primary health care provider every year for a checkup. °· Work with a mental health care provider as needed. °· Eat a well-balanced diet, and eat regular meals. °· Get plenty of rest. °· Exercise if you are able. Just 30 minutes of exercise each day can help you feel better. °· Take over-the-counter and prescription medicines only as told by   your health care provider. Ask your mental health care provider about the possible side effects of any medicines you are taking. °· Do not use alcohol or drugs, and remove these substances from your home. °· Remove weapons, poisons, knives, and other deadly  items from your home. °General recommendations °· Keep your living space well lit. °· When you are feeling well, write yourself a letter with tips and support that you can read when you are not feeling well. °· Remember that life's difficulties can be sorted out with help. Conditions can be treated, and you can learn behaviors and ways of thinking that will help you. °Where to find more information °· National Suicide Prevention Lifeline: www.suicidepreventionlifeline.org °· Hopeline: www.hopeline.com °· American Foundation for Suicide Prevention: www.afsp.org °· The Trevor Project (for lesbian, gay, bisexual, transgender, or questioning youth): www.thetrevorproject.org °Contact a health care provider if: °· You feel as though you are a burden to others. °· You feel agitated, angry, vengeful, or have extreme mood swings. °· You have withdrawn from family and friends. °Get help right away if: °· You are talking about suicide or wishing to die. °· You start making plans for how to commit suicide. °· You feel that you have no reason to live. °· You start making plans for putting your affairs in order, saying goodbye, or giving your possessions away. °· You feel guilt, shame, or unbearable pain, and it seems like there is no way out. °· You are frequently using drugs or alcohol. °· You are engaging in risky behaviors that could lead to death. °If you have any of these symptoms, get help right away. Call emergency services, go to your nearest emergency department or crisis center, or call a suicide crisis helpline. °Summary °· Suicide is when you take your own life. °· Promise yourself that you will not do anything extreme when you have suicidal feelings. °· Let family, friends, teachers, or counselors know how you are feeling. °· Get help right away if you feel as though life is getting too tough to handle and you are thinking about suicide. °This information is not intended to replace advice given to you by your health  care provider. Make sure you discuss any questions you have with your health care provider. °Document Released: 02/18/2003 Document Revised: 03/27/2017 Document Reviewed: 03/27/2017 °Elsevier Interactive Patient Education © 2019 Elsevier Inc. ° °

## 2018-09-17 NOTE — Progress Notes (Signed)
Date:  09/17/2018   Name:  Ronald Fry   DOB:  10-18-1960   MRN:  [id number]   Chief Complaint: Ankle Pain (R) ankle pain. X 6 months. Aching pain. ) and Depression (PHQ9- 21)  Ankle Pain   The injury mechanism was an eversion injury (25+ year ago - severe sprain). The pain is present in the right ankle. The quality of the pain is described as aching. The pain is moderate. The pain has been worsening since onset. Associated symptoms include an inability to bear weight and a loss of motion. Pertinent negatives include no muscle weakness, numbness or tingling. He reports no foreign bodies present. The symptoms are aggravated by movement, palpation and weight bearing. He has tried acetaminophen for the symptoms. The treatment provided mild relief.  Depression         This is a new problem.  The current episode started 1 to 4 weeks ago.   The onset quality is sudden (when his wife asked for a divorce). The problem is unchanged.  Associated symptoms include helplessness, hopelessness, decreased interest, appetite change and suicidal ideas.  Associated symptoms include no decreased concentration, does not have insomnia, not irritable, no restlessness and no headaches.     The symptoms are aggravated by family issues.  Past treatments include nothing. ED - he has used Viagra in the past with good effect.  He is hoping to be able to patch things up with his wife and does not want to have performance anxiety.   Review of Systems  Constitutional: Positive for appetite change and unexpected weight change. Negative for diaphoresis and fever.  HENT: Negative for trouble swallowing.   Respiratory: Negative for cough, choking, chest tightness, shortness of breath and wheezing.   Cardiovascular: Negative for chest pain, palpitations and leg swelling.  Gastrointestinal: Negative for abdominal pain, diarrhea and vomiting.  Musculoskeletal: Positive for arthralgias.  Neurological: Negative for dizziness,  tingling, numbness and headaches.  Hematological: Negative for adenopathy.  Psychiatric/Behavioral: Positive for depression, dysphoric mood and suicidal ideas. Negative for decreased concentration and sleep disturbance. The patient is nervous/anxious. The patient does not have insomnia.     Patient Active Problem List   Diagnosis Date Noted  . Mild hyperlipidemia 10/07/2017  . Environmental and seasonal allergies 08/07/2016  . Hypotestosteronism 11/02/2015  . Intermittent palpitations 07/27/2015  . Gastroesophageal reflux disease 07/27/2015  . Asthma, mild intermittent 07/27/2015  . Rhinitis, allergic 07/27/2015  . Vitamin D deficiency 07/27/2015  . Obesity, Class I, BMI 30-34.9 07/26/2015    No Known Allergies  Past Surgical History:  Procedure Laterality Date  . NO PAST SURGERIES    . TREATMENT FISTULA ANAL      Social History   Tobacco Use  . Smoking status: Never Smoker  . Smokeless tobacco: Never Used  Substance Use Topics  . Alcohol use: Yes    Alcohol/week: 0.0 standard drinks  . Drug use: No     Medication list has been reviewed and updated.  Current Meds  Medication Sig  . albuterol (PROVENTIL HFA;VENTOLIN HFA) 108 (90 Base) MCG/ACT inhaler Inhale 2 puffs into the lungs every 4 (four) hours as needed for wheezing or shortness of breath.  Marland Kitchen albuterol (PROVENTIL) (2.5 MG/3ML) 0.083% nebulizer solution Take 3 mLs (2.5 mg total) by nebulization every 6 (six) hours as needed for wheezing or shortness of breath.  . cetirizine (ZYRTEC) 10 MG tablet Take 10 mg by mouth daily.  . fluticasone (FLONASE) 50 MCG/ACT nasal spray Place into  both nostrils daily.    PHQ 2/9 Scores 09/17/2018 10/02/2017 08/07/2016 12/01/2015  PHQ - 2 Score 6 0 0 0  PHQ- 9 Score 21 - - -    Physical Exam Vitals signs and nursing note reviewed.  Constitutional:      General: He is not irritable.He is not in acute distress.    Appearance: He is well-developed.  HENT:     Head: Normocephalic  and atraumatic.  Cardiovascular:     Rate and Rhythm: Normal rate and regular rhythm.     Heart sounds: Normal heart sounds.  Pulmonary:     Effort: Pulmonary effort is normal. No respiratory distress.     Breath sounds: Normal breath sounds and air entry.  Musculoskeletal:     Right ankle: He exhibits decreased range of motion. Tenderness. Medial malleolus and posterior TFL tenderness found. Achilles tendon exhibits no pain and no defect.       Feet:  Skin:    General: Skin is warm and dry.     Findings: No rash.  Neurological:     Mental Status: He is alert and oriented to person, place, and time.  Psychiatric:        Attention and Perception: Attention normal.        Mood and Affect: Mood is depressed.        Speech: Speech normal.        Behavior: Behavior normal.        Thought Content: Thought content normal. Thought content does not include suicidal (no plan to harm himself - has 2 teenagers to keep him here) plan.        Cognition and Memory: Cognition normal.    Wt Readings from Last 3 Encounters:  09/17/18 218 lb (98.9 kg)  10/02/17 240 lb (108.9 kg)  07/02/17 233 lb (105.7 kg)     BP 124/80   Pulse 71   Ht 5\' 10"  (1.778 m)   Wt 218 lb (98.9 kg)   SpO2 96%   BMI 31.28 kg/m   Assessment and Plan: 1. Current severe episode of major depressive disorder without psychotic features without prior episode ([place]) Severe due to social issues - will refer to Psych in [place] and start Lexapro Follow up in one month - Ambulatory referral to Psychiatry - escitalopram (LEXAPRO) 10 MG tablet; Take 1 tablet (10 mg total) by mouth daily.  Dispense: 30 tablet; Refill: 1  2. 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 Erectile dysfunction, unspecified erectile dysfunction type - sildenafil (VIAGRA) 50 MG tablet; Take 1 tablet (50 mg total) by mouth daily as needed for erectile dysfunction.  Dispense: 20 tablet; Refill: 0   Partially  dictated using Editor, commissioning. Any errors are unintentional.  Halina Maidens, MD [facility]  09/17/2018

## 2018-09-18 ENCOUNTER — Other Ambulatory Visit: Payer: Self-pay | Admitting: Internal Medicine

## 2018-09-18 DIAGNOSIS — M25571 Pain in right ankle and joints of right foot: Principal | ICD-10-CM

## 2018-09-18 DIAGNOSIS — G8929 Other chronic pain: Secondary | ICD-10-CM

## 2018-09-20 ENCOUNTER — Ambulatory Visit
Admission: RE | Admit: 2018-09-20 | Discharge: 2018-09-20 | Disposition: A | Discharge status: Home / Self Care | Payer: 59 | Attending: Internal Medicine | Admitting: Internal Medicine

## 2018-09-20 ENCOUNTER — Ambulatory Visit
Admission: RE | Admit: 2018-09-20 | Discharge: 2018-09-20 | Disposition: A | Discharge status: Home / Self Care | Payer: 59 | Source: Ambulatory Visit | Attending: Internal Medicine | Admitting: Internal Medicine

## 2018-09-20 DIAGNOSIS — G8929 Other chronic pain: Secondary | ICD-10-CM | POA: Insufficient documentation

## 2018-09-20 DIAGNOSIS — M25571 Pain in right ankle and joints of right foot: Principal | ICD-10-CM

## 2018-10-21 ENCOUNTER — Telehealth: Payer: Self-pay | Admitting: Gastroenterology

## 2018-10-21 ENCOUNTER — Other Ambulatory Visit: Payer: Self-pay

## 2018-10-21 ENCOUNTER — Ambulatory Visit (INDEPENDENT_AMBULATORY_CARE_PROVIDER_SITE_OTHER): Payer: 59 | Admitting: Internal Medicine

## 2018-10-21 ENCOUNTER — Encounter: Payer: Self-pay | Admitting: Internal Medicine

## 2018-10-21 VITALS — BP 136/72 | HR 65 | Ht 70.0 in | Wt 203.0 lb

## 2018-10-21 DIAGNOSIS — J4520 Mild intermittent asthma, uncomplicated: Secondary | ICD-10-CM | POA: Diagnosis not present

## 2018-10-21 DIAGNOSIS — Z0000 Encounter for general adult medical examination without abnormal findings: Secondary | ICD-10-CM | POA: Diagnosis not present

## 2018-10-21 DIAGNOSIS — Z1211 Encounter for screening for malignant neoplasm of colon: Secondary | ICD-10-CM

## 2018-10-21 DIAGNOSIS — F324 Major depressive disorder, single episode, in partial remission: Secondary | ICD-10-CM | POA: Diagnosis not present

## 2018-10-21 DIAGNOSIS — K219 Gastro-esophageal reflux disease without esophagitis: Secondary | ICD-10-CM

## 2018-10-21 DIAGNOSIS — N529 Male erectile dysfunction, unspecified: Secondary | ICD-10-CM

## 2018-10-21 DIAGNOSIS — E785 Hyperlipidemia, unspecified: Secondary | ICD-10-CM | POA: Diagnosis not present

## 2018-10-21 DIAGNOSIS — Z125 Encounter for screening for malignant neoplasm of prostate: Secondary | ICD-10-CM

## 2018-10-21 DIAGNOSIS — J3089 Other allergic rhinitis: Secondary | ICD-10-CM | POA: Diagnosis not present

## 2018-10-21 LAB — POCT URINALYSIS DIPSTICK
Bilirubin, UA: NEGATIVE
Blood, UA: NEGATIVE
Glucose, UA: NEGATIVE
KETONES UA: NEGATIVE
Leukocytes, UA: NEGATIVE
Nitrite, UA: NEGATIVE
PH UA: 5 (ref 5.0–8.0)
Protein, UA: NEGATIVE
Spec Grav, UA: 1.01 (ref 1.010–1.025)
UROBILINOGEN UA: 0.2 U/dL

## 2018-10-21 MED ORDER — ALBUTEROL SULFATE 108 (90 BASE) MCG/ACT IN AEPB: 2.0000 | INHALATION_SPRAY | Freq: Four times a day (QID) | RESPIRATORY_TRACT | 12 refills | Status: DC | PRN

## 2018-10-21 MED ORDER — SILDENAFIL CITRATE 50 MG PO TABS: 50.0000 mg | ORAL_TABLET | Freq: Every day | ORAL | 12 refills | Status: DC | PRN

## 2018-10-21 NOTE — Telephone Encounter (Signed)
Patient is returning call to schedule a colonoscopy.

## 2018-10-21 NOTE — Progress Notes (Signed)
Date:  10/21/2018   Name:  Ronald Fry   DOB:  February 15, 1961   MRN:  [id number]   Chief Complaint: Annual Exam Ronald Fry is a 58 y.o. male who presents today for his Complete Annual Exam. He feels fairly well. He reports exercising regularly. He continues to lose weight with diet and exercise. He reports he is sleeping fairly well.  He is now seeing a Social worker through employee health and is doing better.  He sleeps well, mainly because of emotional exhaustion.  His wife has now moved out.  He is staying busy and trying to keep it together for his children.  Depression         This is a new problem.  The current episode started more than 1 month ago.   Associated symptoms include decreased concentration, insomnia, restlessness and decreased interest.  Associated symptoms include no fatigue, no appetite change, no myalgias and no headaches.     The symptoms are aggravated by family issues.  Past treatments include nothing (started last visit but stopped after a middle of the night panic attack). Asthma  There is no shortness of breath or wheezing. This is a recurrent problem. The problem occurs rarely. Pertinent negatives include no appetite change, chest pain, headaches, myalgias or trouble swallowing. His symptoms are aggravated by change in weather and pollen. His symptoms are not alleviated by beta-agonist. His past medical history is significant for asthma.  Hyperlipidemia  This is a chronic problem. Pertinent negatives include no chest pain, myalgias or shortness of breath. Current antihyperlipidemic treatment includes diet change and exercise (started last visit).    Review of Systems  Constitutional: Positive for unexpected weight change. Negative for appetite change, chills, diaphoresis and fatigue.  HENT: Negative for hearing loss, tinnitus, trouble swallowing and voice change.   Eyes: Negative for visual disturbance.  Respiratory: Negative for choking, shortness of breath  and wheezing.   Cardiovascular: Negative for chest pain, palpitations and leg swelling.  Gastrointestinal: Negative for abdominal pain, blood in stool, constipation and diarrhea.  Endocrine: Negative for polydipsia and polyuria.  Genitourinary: Negative for difficulty urinating, dysuria and frequency.  Musculoskeletal: Negative for arthralgias, back pain and myalgias.  Skin: Negative for color change and rash.  Allergic/Immunologic: Positive for environmental allergies. Negative for food allergies.  Neurological: Negative for dizziness, syncope and headaches.  Hematological: Negative for adenopathy.  Psychiatric/Behavioral: Positive for decreased concentration and depression. Negative for dysphoric mood and sleep disturbance. The patient has insomnia.     Patient Active Problem List   Diagnosis Date Noted  . Current severe episode of major depressive disorder without psychotic features without prior episode ([place]) 09/17/2018  . Chronic pain of right ankle 09/17/2018  . Mild hyperlipidemia 10/07/2017  . Environmental and seasonal allergies 08/07/2016  . Hypotestosteronism 11/02/2015  . Intermittent palpitations 07/27/2015  . Gastroesophageal reflux disease 07/27/2015  . Asthma, mild intermittent 07/27/2015  . Vitamin D deficiency 07/27/2015  . Obesity, Class I, BMI 30-34.9 07/26/2015    No Known Allergies  Past Surgical History:  Procedure Laterality Date  . NO PAST SURGERIES    . TREATMENT FISTULA ANAL      Social History   Tobacco Use  . Smoking status: Never Smoker  . Smokeless tobacco: Never Used  Substance Use Topics  . Alcohol use: Yes    Alcohol/week: 0.0 standard drinks  . Drug use: No     Medication list has been reviewed and updated.  Current Meds  Medication Sig  .  albuterol (PROVENTIL HFA;VENTOLIN HFA) 108 (90 Base) MCG/ACT inhaler Inhale 2 puffs into the lungs every 4 (four) hours as needed for wheezing or shortness of breath.  Marland Kitchen albuterol (PROVENTIL)  (2.5 MG/3ML) 0.083% nebulizer solution Take 3 mLs (2.5 mg total) by nebulization every 6 (six) hours as needed for wheezing or shortness of breath.  . cetirizine (ZYRTEC) 10 MG tablet Take 10 mg by mouth daily.  . fluticasone (FLONASE) 50 MCG/ACT nasal spray Place into both nostrils daily.  . sildenafil (VIAGRA) 50 MG tablet Take 1 tablet (50 mg total) by mouth daily as needed for erectile dysfunction.    PHQ 2/9 Scores 10/21/2018 09/17/2018 10/02/2017 08/07/2016  PHQ - 2 Score 0 6 0 0  PHQ- 9 Score 0 21 - -   Wt Readings from Last 3 Encounters:  10/21/18 203 lb (92.1 kg)  09/17/18 218 lb (98.9 kg)  10/02/17 240 lb (108.9 kg)    Physical Exam Vitals signs and nursing note reviewed.  Constitutional:      Appearance: Normal appearance. He is well-developed.  HENT:     Head: Normocephalic.     Right Ear: Tympanic membrane, ear canal and external ear normal.     Left Ear: Tympanic membrane, ear canal and external ear normal.     Nose: Nose normal.     Mouth/Throat:     Pharynx: Uvula midline.  Eyes:     Conjunctiva/sclera: Conjunctivae normal.     Pupils: Pupils are equal, round, and reactive to light.  Neck:     Musculoskeletal: Normal range of motion and neck supple.     Thyroid: No thyromegaly.     Vascular: No carotid bruit.  Cardiovascular:     Rate and Rhythm: Normal rate and regular rhythm.     Heart sounds: Normal heart sounds.  Pulmonary:     Effort: Pulmonary effort is normal.     Breath sounds: Normal breath sounds. No wheezing.  Chest:     Breasts:        Right: No mass.        Left: No mass.  Abdominal:     General: Bowel sounds are normal.     Palpations: Abdomen is soft.     Tenderness: There is no abdominal tenderness.  Musculoskeletal: Normal range of motion.  Lymphadenopathy:     Cervical: No cervical adenopathy.  Skin:    General: Skin is warm and dry.  Neurological:     Mental Status: He is alert and oriented to person, place, and time.     Deep  Tendon Reflexes: Reflexes are normal and symmetric.  Psychiatric:        Speech: Speech normal.        Behavior: Behavior normal.        Thought Content: Thought content normal.        Judgment: Judgment normal.     BP 136/72   Pulse 65   Ht 5\' 10"  (1.778 m)   Wt 203 lb (92.1 kg)   SpO2 97%   BMI 29.13 kg/m   Assessment and Plan: 1. Annual physical exam Continue diet and exercise - Hemoglobin A1c  2. Prostate cancer screening DRE deferred to lack of symptoms - PSA  3. Colon cancer screening - Ambulatory referral to Gastroenterology  4. Mild intermittent asthma without complication stable - Albuterol Sulfate (PROAIR RESPICLICK) 353 (90 Base) MCG/ACT AEPB; Inhale 2 puffs into the lungs 4 (four) times daily as needed.  Dispense: 1 each; Refill: 12  5.  Gastroesophageal reflux disease, esophagitis presence not specified Minimal sx - improved with diet and HOB elevation - CBC with Differential/Platelet  6. Current severe episode of major depressive disorder without psychotic features without prior episode [facility]) Doing better with counseling  7. Mild hyperlipidemia Not currently on statin - will recheck and advise - Comprehensive metabolic panel - Lipid panel  8. Environmental and seasonal allergies Continue PRN medications  9. Erectile dysfunction, unspecified erectile dysfunction type - sildenafil (VIAGRA) 50 MG tablet; Take 1 tablet (50 mg total) by mouth daily as needed for up to 30 days for erectile dysfunction.  Dispense: 6 tablet; Refill: 12   Partially dictated using Editor, commissioning. Any errors are unintentional.  Halina Maidens, MD [facility]  10/21/2018

## 2018-10-21 NOTE — Telephone Encounter (Signed)
Patients call returned.  Colonoscopy has been scheduled with Dr. Allen Norris at [facility] on 11/08/18.  Thanks [employer]

## 2018-10-22 ENCOUNTER — Other Ambulatory Visit: Payer: Self-pay | Admitting: Internal Medicine

## 2018-10-22 DIAGNOSIS — J4520 Mild intermittent asthma, uncomplicated: Secondary | ICD-10-CM

## 2018-10-22 LAB — CBC WITH DIFFERENTIAL/PLATELET
Basophils Absolute: 0 10*3/uL (ref 0.0–0.2)
Basos: 1 %
EOS (ABSOLUTE): 0.1 10*3/uL (ref 0.0–0.4)
Eos: 1 %
Hematocrit: 48.2 % (ref 37.5–51.0)
Hemoglobin: 16.5 g/dL (ref 13.0–17.7)
Immature Grans (Abs): 0 10*3/uL (ref 0.0–0.1)
Immature Granulocytes: 0 %
Lymphocytes Absolute: 1.6 10*3/uL (ref 0.7–3.1)
Lymphs: 18 %
MCH: 31.3 pg (ref 26.6–33.0)
MCHC: 34.2 g/dL (ref 31.5–35.7)
MCV: 91 fL (ref 79–97)
Monocytes Absolute: 0.6 10*3/uL (ref 0.1–0.9)
Monocytes: 6 %
NEUTROS PCT: 74 %
Neutrophils Absolute: 6.5 10*3/uL (ref 1.4–7.0)
Platelets: 219 10*3/uL (ref 150–450)
RBC: 5.28 x10E6/uL (ref 4.14–5.80)
RDW: 13.1 % (ref 11.6–15.4)
WBC: 8.8 10*3/uL (ref 3.4–10.8)

## 2018-10-22 LAB — COMPREHENSIVE METABOLIC PANEL
ALT: 20 IU/L (ref 0–44)
AST: 14 IU/L (ref 0–40)
Albumin/Globulin Ratio: 2.2 (ref 1.2–2.2)
Albumin: 4.3 g/dL (ref 3.8–4.9)
Alkaline Phosphatase: 57 IU/L (ref 39–117)
BUN/Creatinine Ratio: 7 — ABNORMAL LOW (ref 9–20)
BUN: 6 mg/dL (ref 6–24)
Bilirubin Total: 0.6 mg/dL (ref 0.0–1.2)
CO2: 24 mmol/L (ref 20–29)
Calcium: 9.5 mg/dL (ref 8.7–10.2)
Chloride: 103 mmol/L (ref 96–106)
Creatinine, Ser: 0.85 mg/dL (ref 0.76–1.27)
GFR calc Af Amer: 110 mL/min/{1.73_m2} (ref >59)
GFR calc non Af Amer: 95 mL/min/{1.73_m2} (ref >59)
GLUCOSE: 85 mg/dL (ref 65–99)
Globulin, Total: 2 g/dL (ref 1.5–4.5)
Potassium: 4.4 mmol/L (ref 3.5–5.2)
Sodium: 142 mmol/L (ref 134–144)
Total Protein: 6.3 g/dL (ref 6.0–8.5)

## 2018-10-22 LAB — LIPID PANEL
CHOLESTEROL TOTAL: 132 mg/dL (ref 100–199)
Chol/HDL Ratio: 3 ratio (ref 0.0–5.0)
HDL: 44 mg/dL (ref >39)
LDL Calculated: 68 mg/dL (ref 0–99)
Triglycerides: 98 mg/dL (ref 0–149)
VLDL Cholesterol Cal: 20 mg/dL (ref 5–40)

## 2018-10-22 LAB — HEMOGLOBIN A1C
Est. average glucose Bld gHb Est-mCnc: 100 mg/dL
Hgb A1c MFr Bld: 5.1 % (ref 4.8–5.6)

## 2018-10-22 LAB — PSA: Prostate Specific Ag, Serum: 4.2 ng/mL — ABNORMAL HIGH (ref 0.0–4.0)

## 2018-10-22 MED ORDER — ALBUTEROL SULFATE HFA 108 (90 BASE) MCG/ACT IN AERS: 2.0000 | INHALATION_SPRAY | Freq: Four times a day (QID) | RESPIRATORY_TRACT | 5 refills | Status: DC | PRN

## 2018-10-25 ENCOUNTER — Other Ambulatory Visit: Payer: Self-pay

## 2018-10-25 DIAGNOSIS — R972 Elevated prostate specific antigen [PSA]: Secondary | ICD-10-CM

## 2018-10-29 ENCOUNTER — Other Ambulatory Visit: Payer: Self-pay

## 2018-10-29 ENCOUNTER — Encounter: Payer: Self-pay | Admitting: *Deleted

## 2018-10-30 ENCOUNTER — Encounter: Payer: Self-pay | Admitting: Anesthesiology

## 2018-11-06 NOTE — Discharge Instructions (Signed)
General Anesthesia, Adult, Care After  This sheet gives you information about how to care for yourself after your procedure. Your health care provider may also give you more specific instructions. If you have problems or questions, contact your health care provider.  What can I expect after the procedure?  After the procedure, the following side effects are common:  Pain or discomfort at the IV site.  Nausea.  Vomiting.  Sore throat.  Trouble concentrating.  Feeling cold or chills.  Weak or tired.  Sleepiness and fatigue.  Soreness and body aches. These side effects can affect parts of the body that were not involved in surgery.  Follow these instructions at home:    For at least 24 hours after the procedure:  Have a responsible adult stay with you. It is important to have someone help care for you until you are awake and alert.  Rest as needed.  Do not:  Participate in activities in which you could fall or become injured.  Drive.  Use heavy machinery.  Drink alcohol.  Take sleeping pills or medicines that cause drowsiness.  Make important decisions or sign legal documents.  Take care of children on your own.  Eating and drinking  Follow any instructions from your health care provider about eating or drinking restrictions.  When you feel hungry, start by eating small amounts of foods that are soft and easy to digest (bland), such as toast. Gradually return to your regular diet.  Drink enough fluid to keep your urine pale yellow.  If you vomit, rehydrate by drinking water, juice, or clear broth.  General instructions  If you have sleep apnea, surgery and certain medicines can increase your risk for breathing problems. Follow instructions from your health care provider about wearing your sleep device:  Anytime you are sleeping, including during daytime naps.  While taking prescription pain medicines, sleeping medicines, or medicines that make you drowsy.  Return to your normal activities as told by your health care  provider. Ask your health care provider what activities are safe for you.  Take over-the-counter and prescription medicines only as told by your health care provider.  If you smoke, do not smoke without supervision.  Keep all follow-up visits as told by your health care provider. This is important.  Contact a health care provider if:  You have nausea or vomiting that does not get better with medicine.  You cannot eat or drink without vomiting.  You have pain that does not get better with medicine.  You are unable to pass urine.  You develop a skin rash.  You have a fever.  You have redness around your IV site that gets worse.  Get help right away if:  You have difficulty breathing.  You have chest pain.  You have blood in your urine or stool, or you vomit blood.  Summary  After the procedure, it is common to have a sore throat or nausea. It is also common to feel tired.  Have a responsible adult stay with you for the first 24 hours after general anesthesia. It is important to have someone help care for you until you are awake and alert.  When you feel hungry, start by eating small amounts of foods that are soft and easy to digest (bland), such as toast. Gradually return to your regular diet.  Drink enough fluid to keep your urine pale yellow.  Return to your normal activities as told by your health care provider. Ask your health care   provider what activities are safe for you.  This information is not intended to replace advice given to you by your health care provider. Make sure you discuss any questions you have with your health care provider.  Document Released: 11/20/2000 Document Revised: 03/30/2017 Document Reviewed: 03/30/2017  Elsevier Interactive Patient Education  2019 [employer]vier Inc[employer]

## 2018-11-08 ENCOUNTER — Emergency Department: Payer: 59

## 2018-11-08 ENCOUNTER — Other Ambulatory Visit: Payer: Self-pay

## 2018-11-08 ENCOUNTER — Telehealth: Payer: Self-pay

## 2018-11-08 ENCOUNTER — Encounter: Payer: Self-pay | Admitting: Emergency Medicine

## 2018-11-08 ENCOUNTER — Ambulatory Visit
Admission: RE | Admit: 2018-11-08 | Discharge: 2018-11-08 | Disposition: A | Discharge status: Home / Self Care | Payer: 59 | Attending: Gastroenterology | Admitting: Gastroenterology

## 2018-11-08 ENCOUNTER — Observation Stay: Admission: EM | Admit: 2018-11-08 | Payer: Self-pay | Source: Ambulatory Visit | Admitting: Internal Medicine

## 2018-11-08 ENCOUNTER — Ambulatory Visit: Payer: 59 | Admitting: Anesthesiology

## 2018-11-08 ENCOUNTER — Encounter
Admission: RE | Disposition: A | Discharge status: Home / Self Care | Payer: Self-pay | Source: Home / Self Care | Attending: Gastroenterology

## 2018-11-08 ENCOUNTER — Emergency Department (EMERGENCY_DEPARTMENT_HOSPITAL)
Admission: EM | Admit: 2018-11-08 | Discharge: 2018-11-08 | Disposition: A | Discharge status: Home / Self Care | Payer: 59 | Source: Home / Self Care | Attending: Emergency Medicine | Admitting: Emergency Medicine

## 2018-11-08 DIAGNOSIS — I482 Chronic atrial fibrillation, unspecified: Secondary | ICD-10-CM | POA: Diagnosis not present

## 2018-11-08 DIAGNOSIS — Z79899 Other long term (current) drug therapy: Secondary | ICD-10-CM | POA: Insufficient documentation

## 2018-11-08 DIAGNOSIS — Z6829 Body mass index (BMI) 29.0-29.9, adult: Secondary | ICD-10-CM | POA: Insufficient documentation

## 2018-11-08 DIAGNOSIS — I4891 Unspecified atrial fibrillation: Secondary | ICD-10-CM | POA: Insufficient documentation

## 2018-11-08 DIAGNOSIS — Z1211 Encounter for screening for malignant neoplasm of colon: Secondary | ICD-10-CM | POA: Insufficient documentation

## 2018-11-08 DIAGNOSIS — Z9889 Other specified postprocedural states: Secondary | ICD-10-CM

## 2018-11-08 DIAGNOSIS — J45909 Unspecified asthma, uncomplicated: Secondary | ICD-10-CM | POA: Insufficient documentation

## 2018-11-08 DIAGNOSIS — I10 Essential (primary) hypertension: Secondary | ICD-10-CM | POA: Insufficient documentation

## 2018-11-08 DIAGNOSIS — D128 Benign neoplasm of rectum: Secondary | ICD-10-CM | POA: Diagnosis not present

## 2018-11-08 DIAGNOSIS — I480 Paroxysmal atrial fibrillation: Secondary | ICD-10-CM | POA: Diagnosis not present

## 2018-11-08 DIAGNOSIS — K621 Rectal polyp: Secondary | ICD-10-CM | POA: Insufficient documentation

## 2018-11-08 DIAGNOSIS — R000 Tachycardia, unspecified: Secondary | ICD-10-CM | POA: Diagnosis not present

## 2018-11-08 DIAGNOSIS — K640 First degree hemorrhoids: Secondary | ICD-10-CM | POA: Diagnosis not present

## 2018-11-08 DIAGNOSIS — Z01810 Encounter for preprocedural cardiovascular examination: Secondary | ICD-10-CM | POA: Diagnosis not present

## 2018-11-08 HISTORY — PX: POLYPECTOMY: SHX5525

## 2018-11-08 HISTORY — PX: COLONOSCOPY WITH PROPOFOL: SHX5780

## 2018-11-08 HISTORY — DX: Personal history of other specified conditions: Z87.898

## 2018-11-08 LAB — CBC WITH DIFFERENTIAL/PLATELET
Abs Immature Granulocytes: 0.04 10*3/uL (ref 0.00–0.07)
Basophils Absolute: 0 10*3/uL (ref 0.0–0.1)
Basophils Relative: 0 %
EOS ABS: 0 10*3/uL (ref 0.0–0.5)
EOS PCT: 0 %
HCT: 46.4 % (ref 39.0–52.0)
Hemoglobin: 16.4 g/dL (ref 13.0–17.0)
Immature Granulocytes: 0 %
Lymphocytes Relative: 21 %
Lymphs Abs: 2.1 10*3/uL (ref 0.7–4.0)
MCH: 31.3 pg (ref 26.0–34.0)
MCHC: 35.3 g/dL (ref 30.0–36.0)
MCV: 88.5 fL (ref 80.0–100.0)
MONO ABS: 0.6 10*3/uL (ref 0.1–1.0)
Monocytes Relative: 7 %
Neutro Abs: 7 10*3/uL (ref 1.7–7.7)
Neutrophils Relative %: 72 %
Platelets: 219 10*3/uL (ref 150–400)
RBC: 5.24 MIL/uL (ref 4.22–5.81)
RDW: 12.5 % (ref 11.5–15.5)
WBC: 9.8 10*3/uL (ref 4.0–10.5)
nRBC: 0 % (ref 0.0–0.2)

## 2018-11-08 LAB — BASIC METABOLIC PANEL
Anion gap: 14 (ref 5–15)
BUN: 10 mg/dL (ref 6–20)
CO2: 20 mmol/L — ABNORMAL LOW (ref 22–32)
Calcium: 9 mg/dL (ref 8.9–10.3)
Chloride: 106 mmol/L (ref 98–111)
Creatinine, Ser: 0.77 mg/dL (ref 0.61–1.24)
GFR calc Af Amer: >60 mL/min (ref >60)
GFR calc non Af Amer: >60 mL/min (ref >60)
Glucose, Bld: 74 mg/dL (ref 70–99)
Potassium: 4 mmol/L (ref 3.5–5.1)
Sodium: 140 mmol/L (ref 135–145)

## 2018-11-08 LAB — APTT: aPTT: 29 seconds (ref 24–36)

## 2018-11-08 LAB — TROPONIN I: Troponin I: <0.03 ng/mL (ref <0.03)

## 2018-11-08 LAB — PROTIME-INR
INR: 1 (ref 0.8–1.2)
Prothrombin Time: 13 seconds (ref 11.4–15.2)

## 2018-11-08 LAB — TSH: TSH: 1.758 u[IU]/mL (ref 0.350–4.500)

## 2018-11-08 SURGERY — COLONOSCOPY WITH PROPOFOL
Anesthesia: General | Site: Rectum

## 2018-11-08 MED ORDER — STERILE WATER FOR IRRIGATION IR SOLN
Status: DC | PRN
  Administered 2018-11-08: 09:00:00

## 2018-11-08 MED ORDER — LACTATED RINGERS IV SOLN
INTRAVENOUS | Status: DC
  Administered 2018-11-08: 09:00:00 via INTRAVENOUS

## 2018-11-08 MED ORDER — METOPROLOL TARTRATE 5 MG/5ML IV SOLN
INTRAVENOUS | Status: DC | PRN
  Administered 2018-11-08 (×2): 2 mg via INTRAVENOUS
  Administered 2018-11-08: 1 mg via INTRAVENOUS

## 2018-11-08 MED ORDER — METOPROLOL TARTRATE 25 MG PO TABS
25.0000 mg | ORAL_TABLET | Freq: Once | ORAL | Status: AC
  Administered 2018-11-08: 25 mg via ORAL
  Filled 2018-11-08: qty 1

## 2018-11-08 MED ORDER — OXYCODONE HCL 5 MG PO TABS: 5.0000 mg | ORAL_TABLET | Freq: Once | ORAL | Status: DC | PRN

## 2018-11-08 MED ORDER — METOPROLOL SUCCINATE ER 25 MG PO TB24: 25.0000 mg | ORAL_TABLET | Freq: Every day | ORAL | 0 refills | Status: DC

## 2018-11-08 MED ORDER — LIDOCAINE HCL (CARDIAC) PF 100 MG/5ML IV SOSY
PREFILLED_SYRINGE | INTRAVENOUS | Status: DC | PRN
  Administered 2018-11-08: 30 mg via INTRAVENOUS

## 2018-11-08 MED ORDER — PROPOFOL 10 MG/ML IV BOLUS
INTRAVENOUS | Status: DC | PRN
  Administered 2018-11-08: 50 mg via INTRAVENOUS
  Administered 2018-11-08: 100 mg via INTRAVENOUS
  Administered 2018-11-08 (×2): 50 mg via INTRAVENOUS

## 2018-11-08 MED ORDER — OXYCODONE HCL 5 MG/5ML PO SOLN: 5.0000 mg | Freq: Once | ORAL | Status: DC | PRN

## 2018-11-08 MED ORDER — SODIUM CHLORIDE 0.9 % IV SOLN: INTRAVENOUS | Status: DC

## 2018-11-08 SURGICAL SUPPLY — 6 items
CANISTER SUCT 1200ML W/VALVE (MISCELLANEOUS) ×3 IMPLANT
FORCEPS BIOP RAD 4 LRG CAP 4 (CUTTING FORCEPS) ×3 IMPLANT
GOWN CVR UNV OPN BCK APRN NK (MISCELLANEOUS) ×2 IMPLANT
GOWN ISOL THUMB LOOP REG UNIV (MISCELLANEOUS) ×4
KIT ENDO PROCEDURE OLY (KITS) ×3 IMPLANT
WATER STERILE IRR 250ML POUR (IV SOLUTION) ×3 IMPLANT

## 2018-11-08 NOTE — Op Note (Signed)
[facility] Patient Name: Ronald Fry Procedure Date: 11/08/2018 8:49 AM MRN: [id number] Account #: [id number] Date of Birth: [birth year] Admit Type: Outpatient Age: 58 Room: [facility] OR ROOM 01 Gender: Male Note Status: Finalized Procedure:            Colonoscopy Indications:          Screening for colorectal malignant neoplasm Providers:            Lucilla Lame MD, MD Referring MD:         Halina Maidens, MD (Referring MD) Medicines:            Propofol per Anesthesia Complications:        No immediate complications. Procedure:            Pre-Anesthesia Assessment:                       - Prior to the procedure, a History and Physical was                        performed, and patient medications and allergies were                        reviewed. The patient's tolerance of previous                        anesthesia was also reviewed. The risks and benefits of                        the procedure and the sedation options and risks were                        discussed with the patient. All questions were                        answered, and informed consent was obtained. Prior                        Anticoagulants: The patient has taken no previous                        anticoagulant or antiplatelet agents. ASA Grade                        Assessment: II - A patient with mild systemic disease.                        After reviewing the risks and benefits, the patient was                        deemed in satisfactory condition to undergo the                        procedure.                       After obtaining informed consent, the colonoscope was                        passed under direct vision. Throughout the procedure,  the patient's blood pressure, pulse, and oxygen                        saturations were monitored continuously. The                        Colonoscope was introduced through the anus and                         advanced to the the cecum, identified by appendiceal                        orifice and ileocecal valve. The colonoscopy was                        performed without difficulty. The patient tolerated the                        procedure well. The quality of the bowel preparation                        was excellent. Findings:      The perianal and digital rectal examinations were normal.      Two sessile polyps were found in the rectum. The polyps were 2 to 4 mm       in size. These polyps were removed with a cold biopsy forceps. Resection       and retrieval were complete.      Non-bleeding internal hemorrhoids were found during retroflexion. The       hemorrhoids were Grade I (internal hemorrhoids that do not prolapse). Impression:           - Two 2 to 4 mm polyps in the rectum, removed with a                        cold biopsy forceps. Resected and retrieved.                       - Non-bleeding internal hemorrhoids. Recommendation:       - Discharge patient to home.                       - Resume previous diet.                       - Continue present medications.                       - Await pathology results.                       - Repeat colonoscopy in 5 years if polyp adenoma and 10                        years if hyperplastic Procedure Code(s):    --- Professional ---                       [id number], Colonoscopy, flexible; with biopsy, single or                        multiple Diagnosis Code(s):    --- Professional ---  Z12.11, Encounter for screening for malignant neoplasm                        of colon                       K62.1, Rectal polyp CPT copyright 2018 American Medical Association. All rights reserved. The codes documented in this report are preliminary and upon coder review may  be revised to meet current compliance requirements. Lucilla Lame MD, MD 11/08/2018 9:13:25 AM This report has been signed electronically. Number of Addenda:  0 Note Initiated On: 11/08/2018 8:49 AM Scope Withdrawal Time: 0 hours 8 minutes 3 seconds  Total Procedure Duration: 0 hours 9 minutes 30 seconds       [facility]

## 2018-11-08 NOTE — Telephone Encounter (Signed)
Scheduled.  Unable to arrive as ED nurse in chart will attempt at another time

## 2018-11-08 NOTE — ED Notes (Signed)
Pt provided with meal tray at this time, visualized in NAD. WIll continue to monitor for further patient needs.

## 2018-11-08 NOTE — Anesthesia Procedure Notes (Signed)
Date/Time: 11/08/2018 8:58 AM Performed by: Cameron Ali, CRNA Pre-anesthesia Checklist: Patient identified, Emergency Drugs available, Suction available, Timeout performed and Patient being monitored Patient Re-evaluated:Patient Re-evaluated prior to induction Oxygen Delivery Method: Nasal cannula Placement Confirmation: positive ETCO2

## 2018-11-08 NOTE — Consult Note (Signed)
Cardiology Consultation:   Patient ID: Ronald Fry MRN: [id number]; DOB: 1960/12/18  Admit date: 11/08/2018 Date of Consult: 11/08/2018  Primary Care Provider: Glean Hess, MD Primary Cardiologist: Ida Rogue, MD  Primary Electrophysiologist:  None   Patient Profile:   Ronald Fry is a 58 y.o. male with a hx of new onset paroxysmal Afib, hypertension, palpitations, asthma, GERD, and seasonal allergic rhinitis who is being seen today for the evaluation of new onset paroxysmal atrial fibrillation with rapid ventricular response at the request of Dr. Burlene Arnt.  History of Present Illness:   Ronald Fry is a 58 year old male with PMH as above.  Of note, he has a h/o HTN but states this has been well controlled, and he has not taken antihypertensive medications for 2 years.   Prior to this visit to the ED, he has no significant cardiac history per review of EMR and per patient report. Patient denied a previous history of Afib.   On 11/08/2018 (today), patient presented to [facility] for scheduled and non-emergent colonoscopy and screening for colorectal malignant neoplasm.  This procedure was completed successfully with 2 sessile polyps found removed and nonbleeding internal hemorrhoids also noted per review of documentation.  Following the procedure, a 12-lead EKG was performed that showed new onset atrial fibrillation with RVR and subsequent ventricular rates of 120-130s bpm.  Patient reportedly was asx at that time as he denied any chest pain or shortness of breath with this rhythm. Vital signs were also documented as stable. After the EKG, 2 mg of metoprolol was given to the patient in the OR and PACU with noted slight improvement in ventricular rate to 110s but persistence of atrial fibrillation.  He was therefore transferred to [facility] ED.   Past Medical History:  Diagnosis Date   Allergy    Asthma    with allergy flares   History of palpitations    none  recently   Hypertension    H/O. no meds approx 2 yrs    Past Surgical History:  Procedure Laterality Date   NO PAST SURGERIES     TREATMENT FISTULA ANAL       Home Medications:  Prior to Admission medications   Medication Sig Start Date End Date Taking? Authorizing Provider  albuterol (PROAIR HFA) 108 (90 Base) MCG/ACT inhaler Inhale 2 puffs into the lungs every 6 (six) hours as needed for wheezing or shortness of breath. 10/22/18   Glean Hess, MD  albuterol (PROVENTIL) (2.5 MG/3ML) 0.083% nebulizer solution Take 3 mLs (2.5 mg total) by nebulization every 6 (six) hours as needed for wheezing or shortness of breath. 03/20/17   Glean Hess, MD  atorvastatin (LIPITOR) 10 MG tablet Take 1 tablet (10 mg total) by mouth daily. Patient not taking: Reported on 10/21/2018 10/09/17   Glean Hess, MD  cetirizine (ZYRTEC) 10 MG tablet Take 10 mg by mouth daily.    [provider]  fluticasone (FLONASE) 50 MCG/ACT nasal spray Place into both nostrils daily.    [provider]  metoprolol succinate (TOPROL XL) 25 MG 24 hr tablet Take 1 tablet (25 mg total) by mouth daily for 30 days. 11/08/18 12/08/18  Schuyler Amor, MD  sildenafil (VIAGRA) 50 MG tablet Take 1 tablet (50 mg total) by mouth daily as needed for up to 30 days for erectile dysfunction. 10/21/18 11/20/18  Glean Hess, MD    Inpatient Medications: Scheduled Meds:  Continuous Infusions:  PRN Meds:   Allergies:  No Known Allergies  Social History:   Social History   Socioeconomic History   Marital status: Married    Spouse name: Not on file   Number of children: Not on file   Years of education: Not on file   Highest education level: Not on file  Occupational History   Occupation: Mudlogger  Social Needs   Financial resource strain: Not on file   Food insecurity:    Worry: Not on file    Inability: Not on file   Transportation needs:    Medical: Not on file     Non-medical: Not on file  Tobacco Use   Smoking status: Never Smoker   Smokeless tobacco: Never Used  Substance and Sexual Activity   Alcohol use: Yes    Alcohol/week: 1.0 standard drinks    Types: 1 Cans of beer per week   Drug use: No   Sexual activity: Not on file  Lifestyle   Physical activity:    Days per week: Not on file    Minutes per session: Not on file   Stress: Not on file  Relationships   Social connections:    Talks on phone: Not on file    Gets together: Not on file    Attends religious service: Not on file    Active member of club or organization: Not on file    Attends meetings of clubs or organizations: Not on file    Relationship status: Not on file   Intimate partner violence:    Fear of current or ex partner: Not on file    Emotionally abused: Not on file    Physically abused: Not on file    Forced sexual activity: Not on file  Other Topics Concern   Not on file  Social History Narrative   Not on file    Family History:    Family History  Problem Relation Age of Onset   Stroke Mother 52   Stroke Father 33     ROS:  Please see the history of present illness.   Review of Systems  Constitutional: Negative for chills, fever, malaise/fatigue and weight loss.  Respiratory: Negative for shortness of breath.   Cardiovascular: Negative for chest pain, palpitations, orthopnea and leg swelling.  Gastrointestinal: Negative for abdominal pain, constipation, diarrhea, nausea and vomiting.  Musculoskeletal: Negative for falls.  Psychiatric/Behavioral: Negative for substance abuse.  All other systems reviewed and are negative.   All other ROS reviewed and negative.     Physical Exam/Data:   Vitals:   11/08/18 1100 11/08/18 1104 11/08/18 1130 11/08/18 1200  BP: 113/84 113/84 (!) 118/91 (!) 115/98  Pulse: (!) 103 (!) 122 (!) 109 91  Resp: (!) 9  14 15   Temp:      TempSrc:      SpO2: 98%  97% 96%  Weight:      Height:       No intake  or output data in the 24 hours ending 11/08/18 1331 Filed Weights   11/08/18 1052  Weight: 87.1 kg   Body mass index is 27.55 kg/m.  General:  Well nourished, well developed, in no acute distress HEENT: normal Neck: no JVD Vascular: Radial pulses 2+ bilaterally   Cardiac:  normal S1, S2; RRR; no murmur  Lungs:  clear to auscultation bilaterally, no wheezing, rhonchi or rales  Abd: soft, nontender, no hepatomegaly  Ext: no bilateral lower extremity edema Musculoskeletal:  No deformities Skin: warm and dry  Neuro:  no focal  abnormalities noted Psych:  Normal affect   EKG: Afib with RVR and rate 120s Telemetry:  Telemetry was personally reviewed and demonstrates: Paroxysmal Afib with RVR and converting to Sinus Rhythm around 12:59PM   CV Studies:   Relevant CV Studies: None  Laboratory Data:  Chemistry Recent Labs  Lab 11/08/18 1054  NA 140  K 4.0  CL 106  CO2 20*  GLUCOSE 74  BUN 10  CREATININE 0.77  CALCIUM 9.0  GFRNONAA >60  GFRAA >60  ANIONGAP 14    No results for input(s): PROT, ALBUMIN, AST, ALT, ALKPHOS, BILITOT in the last 168 hours. Hematology Recent Labs  Lab 11/08/18 1054  WBC 9.8  RBC 5.24  HGB 16.4  HCT 46.4  MCV 88.5  MCH 31.3  MCHC 35.3  RDW 12.5  PLT 219   Cardiac Enzymes Recent Labs  Lab 11/08/18 1054  TROPONINI <0.03   No results for input(s): TROPIPOC in the last 168 hours.  BNPNo results for input(s): BNP, PROBNP in the last 168 hours.  DDimer No results for input(s): DDIMER in the last 168 hours.  Radiology/Studies:  Dg Chest Port 1 View  Result Date: 11/08/2018 CLINICAL DATA:  Atrial fibrillation after anesthesia for colonoscopy. EXAM: PORTABLE CHEST 1 VIEW COMPARISON:  None. FINDINGS: The heart size and mediastinal contours are within normal limits for technique. Minor atelectasis. There is no edema, consolidation, effusion, or pneumothorax. The visualized skeletal structures are unremarkable. IMPRESSION: 1. Minimal  atelectasis. 2. Negative for pulmonary edema. Electronically Signed   By: Monte Fantasia M.D.   On: 11/08/2018 11:10    Assessment and Plan:   New Onset Paroxysmal Atrial Fibrillation with RVR - Asx in Afib. No feelings of racing HR, palpitations, or SOB noted in Afib. To his knowledge, no previous h/o Afib.  H/o palpitations; however, as above, asx in Afib.  - Presumed to be new onset Afib, which was noted by routine EKG today and after a non-emergent colonoscopy as above in HPI. Suspicion etiology of Afib likely related to colonoscopy and (per patient report) social stress of going through divorce. Telemetry review shows occasional PVCs and patient in and out of Afib while in ED with persistent NSR since ~12:58PM today (3/13). - CHA2DS2VASc score of 0 (no current HTN).  No need for anticoagulation or ASA at this time.  - HR 60s in NSR and 120s-130s when in paroxysmal Afib. At this time, unknown how frequently patient goes in and out of Afib. Recommendation for discharge with Toprol XL 25mg  to use if HR above 100s and with patient agreeable to monitor HR on watch at home. We will also discharge with a Zio monitor to assess burden / frequency of Afib. No indication/ plan for cardioversion given NSR and paroxysmal. Will schedule outpatient follow-up in our office to review results of Zio.  History of HTN - No current antihypertensives. Documented history but BP currently controlled without medication.     For questions or updates, please contact [place] Please consult [email or web address] for contact info under     Signed, Arvil Chaco, PA-C  11/08/2018 1:31 PM

## 2018-11-08 NOTE — ED Notes (Signed)
NAD noted at time of D/C. Pt taken to lobby via wheelchair by Karl Bales, Therapist, sports. Pt and SO denies any comments/concerns regarding D/C instructions at this time.

## 2018-11-08 NOTE — ED Triage Notes (Signed)
Pt presents to ED via [facility] from [facility], pt states was having routine colonoscopy when they noted he had new onset a-fib. Pt denies CP, SOB. Pt is alert and oriented at this time. Per EMS pt was to be direct admit to floor, however no bed available so they sent to ED for admission.

## 2018-11-08 NOTE — Telephone Encounter (Signed)
Call from Dr. Rockey Situ in ED, wanting to place 14 day zio for new onset a fib.   Zio placed and instructions given on use and sending back,  "A zio monitor was placed today. It will remain on for 14 days. You will then return monitor and event diary in provided box. It takes 1-2 weeks for report to be downloaded and returned to [place]. We will call you with the results. If monitor falls of or has orange flashing light, please call Zio for further instructions. "  Pt verbalized understanding. He will f/u in clinic as advised at discharge.   Advised pt to call for any further questions or concerns.

## 2018-11-08 NOTE — ED Notes (Addendum)
Pt noted to be intermittently in A-fib, initially on arrival pt in a-fib with RVR with a rate of 120's. Pt converted to HR NSR of 60 then back to A-fib 90-100. Pt denies CP or SOB when converting in and out of A-fib.

## 2018-11-08 NOTE — Progress Notes (Signed)
Pt had colonoscopy (by Dr. Allen Norris) today at [facility].  CTSP in OR for tachycardia at the end of procedure, when scope was in the rectum. HR=130s.  SBP=90s.  Rhythm appeared to be irregular with no p-waves. Sedation stopped. Other VSS.  EKG in Pacu shows new onset Afib at 120s. Other VSS.  Pt denies SOB or CP. Pt awake and alert.  IV Metoprolol given in OR and Pacu. HR slowed, but still irregular in 120s.  D/w family and Dr. Allen Norris.  Will transfer pt to hospital via ambulance.

## 2018-11-08 NOTE — Discharge Instructions (Addendum)
You were in atrial fibrillation, this could come back.  If you have chest pain, shortness of breath, or any other concerns including numbness or weakness or difficulty speaking return immediately to the emergency room.  I would advise taking baby aspirin every day.  Please follow close with cardiology and wear the monitor as they have described for you.

## 2018-11-08 NOTE — Transfer of Care (Signed)
Immediate Anesthesia Transfer of Care Note  Patient: Ronald Fry  Procedure(s) Performed: COLONOSCOPY WITH BIOPSY (N/A Rectum) POLYPECTOMY (N/A Rectum)  Patient Location: PACU  Anesthesia Type: General  Level of Consciousness: awake, alert  and patient cooperative  Airway and Oxygen Therapy: Patient Spontanous Breathing and Patient connected to supplemental oxygen  Post-op Assessment: Post-op Vital signs reviewed, Patient's Cardiovascular Status Stable, Respiratory Function Stable, Patent Airway and No signs of Nausea or vomiting  Post-op Vital Signs: Reviewed and stable  Complications: No apparent anesthesia complications

## 2018-11-08 NOTE — Anesthesia Preprocedure Evaluation (Addendum)
Anesthesia Evaluation  Patient identified by MRN, date of birth, ID band  Reviewed: NPO status   History of Anesthesia Complications Negative for: history of anesthetic complications  Airway Mallampati: II  TM Distance: >3 FB Neck ROM: full    Dental no notable dental hx.    Pulmonary asthma (mild) ,    Pulmonary exam normal        Cardiovascular Exercise Tolerance: Good hypertension, Normal cardiovascular exam  History of palpitations > negative w/u 17 years ago.   Neuro/Psych PSYCHIATRIC DISORDERS Anxiety Depression negative neurological ROS     GI/Hepatic negative GI ROS, Neg liver ROS,   Endo/Other  Morbid obesity (bmi=28)  Renal/GU negative Renal ROS  negative genitourinary   Musculoskeletal   Abdominal   Peds  Hematology negative hematology ROS (+)   Anesthesia Other Findings pcp stable: 09/2018 : dr berglund.  Reproductive/Obstetrics                            Anesthesia Physical Anesthesia Plan  ASA: II  Anesthesia Plan: General   Post-op Pain Management:    Induction:   PONV Risk Score and Plan:   Airway Management Planned: Natural Airway  Additional Equipment:   Intra-op Plan:   Post-operative Plan:   Informed Consent: I have reviewed the patients History and Physical, chart, labs and discussed the procedure including the risks, benefits and alternatives for the proposed anesthesia with the patient or authorized representative who has indicated his/her understanding and acceptance.       Plan Discussed with: CRNA  Anesthesia Plan Comments:         Anesthesia Quick Evaluation

## 2018-11-08 NOTE — Progress Notes (Addendum)
EMS personnel arrived to surgery center. A fib RVR, rate 110s-130s, pt asymptomatic; BP, RR & pulse ox stable. Continues to deny chest pain and/or shortness of breath. Pt has remained NPO per Dr. Denton Lank. Pt placed on EMS' monitor. Pt will be taken via ambulance to [facility] for further evaluation.

## 2018-11-08 NOTE — Anesthesia Postprocedure Evaluation (Signed)
Anesthesia Post Note  Patient: Ronald Fry  Procedure(s) Performed: COLONOSCOPY WITH BIOPSY (N/A Rectum) POLYPECTOMY (N/A Rectum)  Patient location during evaluation: PACU Anesthesia Type: General Level of consciousness: awake and alert Pain management: pain level controlled Vital Signs Assessment: post-procedure vital signs reviewed and stable Respiratory status: spontaneous breathing, nonlabored ventilation, respiratory function stable and patient connected to nasal cannula oxygen Cardiovascular status: blood pressure returned to baseline and tachycardic Postop Assessment: no apparent nausea or vomiting Anesthetic complications: no   EKG in Pacu shows new onset Afib at 120s. Other VSS. Pt denies SOB or CP.  IV Metoprolol given in OR and Pacu. HR slowed, but still irregular in 120s.  Will transfer to hospital via ambulance.  Fidel Levy

## 2018-11-08 NOTE — ED Provider Notes (Signed)
[facility] Emergency Department Provider Note  ____________________________________________   I have reviewed the triage vital signs and the nursing notes. Where available I have reviewed prior notes and, if possible and indicated, outside hospital notes.    HISTORY  Chief Complaint No chief complaint on file.    HPI Ronald Fry is a 58 y.o. male with no known cardiac history, states that he was in his normal state of health no chest pain no palpitations no other complaints, went to have his routine colonoscopy nonemergent from colonoscopy was found to be in atrial fibrillation with a rate in the 120s, this is a new finding for him as far as he knows.  He denies any other complaints or symptoms, he has no chest pain he does not feel palpitations he does not know how long he may have been in this rhythm EKG from the outside facility does show heart rate in the 120s, apparently he was going to be a direct admit but there was a paucity of beds so he was sent to the emergency room.  At this time he has no complaints and feels "fine".    Past Medical History:  Diagnosis Date  . Allergy   . Asthma    with allergy flares  . History of palpitations    none recently  . Hypertension    H/O. no meds approx 2 yrs    Patient Active Problem List   Diagnosis Date Noted  . Major depression in partial remission ([place]) 09/17/2018  . Chronic pain of right ankle 09/17/2018  . Mild hyperlipidemia 10/07/2017  . Environmental and seasonal allergies 08/07/2016  . Hypotestosteronism 11/02/2015  . Intermittent palpitations 07/27/2015  . Gastroesophageal reflux disease 07/27/2015  . Asthma, mild intermittent 07/27/2015  . Vitamin D deficiency 07/27/2015  . Obesity, Class I, BMI 30-34.9 07/26/2015    Past Surgical History:  Procedure Laterality Date  . NO PAST SURGERIES    . TREATMENT FISTULA ANAL      Prior to Admission medications   Medication Sig Start Date  End Date Taking? Authorizing Provider  albuterol (PROAIR HFA) 108 (90 Base) MCG/ACT inhaler Inhale 2 puffs into the lungs every 6 (six) hours as needed for wheezing or shortness of breath. 10/22/18   Glean Hess, MD  albuterol (PROVENTIL) (2.5 MG/3ML) 0.083% nebulizer solution Take 3 mLs (2.5 mg total) by nebulization every 6 (six) hours as needed for wheezing or shortness of breath. 03/20/17   Glean Hess, MD  atorvastatin (LIPITOR) 10 MG tablet Take 1 tablet (10 mg total) by mouth daily. Patient not taking: Reported on 10/21/2018 10/09/17   Glean Hess, MD  cetirizine (ZYRTEC) 10 MG tablet Take 10 mg by mouth daily.    [provider]  fluticasone (FLONASE) 50 MCG/ACT nasal spray Place into both nostrils daily.    [provider]  sildenafil (VIAGRA) 50 MG tablet Take 1 tablet (50 mg total) by mouth daily as needed for up to 30 days for erectile dysfunction. 10/21/18 11/20/18  Glean Hess, MD    Allergies Patient has no known allergies.  Family History  Problem Relation Age of Onset  . Stroke Mother 32  . Stroke Father 5    Social History Social History   Tobacco Use  . Smoking status: Never Smoker  . Smokeless tobacco: Never Used  Substance Use Topics  . Alcohol use: Yes    Alcohol/week: 1.0 standard drinks    Types: 1 Cans of beer per  week  . Drug use: No    Review of Systems Constitutional: No fever/chills Eyes: No visual changes. ENT: No sore throat. No stiff neck no neck pain Cardiovascular: Denies chest pain. Respiratory: Denies shortness of breath. Gastrointestinal:   no vomiting.  No diarrhea.  No constipation. Genitourinary: Negative for dysuria. Musculoskeletal: Negative lower extremity swelling Skin: Negative for rash. Neurological: Negative for severe headaches, focal weakness or numbness.   ____________________________________________   PHYSICAL EXAM:  VITAL SIGNS: ED Triage Vitals  Enc Vitals Group     BP       Pulse      Resp      Temp      Temp src      SpO2      Weight      Height      Head Circumference      Peak Flow      Pain Score      Pain Loc      Pain Edu?      Excl. in [place]?     Constitutional: Alert and oriented. Well appearing and in no acute distress. Eyes: Conjunctivae are normal Head: Atraumatic HEENT: No congestion/rhinnorhea. Mucous membranes are moist.  Oropharynx non-erythematous Neck:   Nontender with no meningismus, no masses, no stridor Cardiovascular: Tachycardia, mild, irregularly irregular rhythm. Grossly normal heart sounds.  Good peripheral circulation. Respiratory: Normal respiratory effort.  No retractions. Lungs CTAB. Abdominal: Soft and nontender. No distention. No guarding no rebound Back:  There is no focal tenderness or step off.  there is no midline tenderness there are no lesions noted. there is no CVA tenderness Musculoskeletal: No lower extremity tenderness, no upper extremity tenderness. No joint effusions, no DVT signs strong distal pulses no edema Neurologic:  Normal speech and language. No gross focal neurologic deficits are appreciated.  Skin:  Skin is warm, dry and intact. No rash noted. Psychiatric: Mood and affect are normal. Speech and behavior are normal.  ____________________________________________   LABS (all labs ordered are listed, but only abnormal results are displayed)  Labs Reviewed - No data to display  Pertinent labs  results that were available during my care of the patient were reviewed by me and considered in my medical decision making (see chart for details). ____________________________________________  EKG  I personally interpreted any EKGs ordered by me or triage  ____________________________________________  RADIOLOGY  Pertinent labs & imaging results that were available during my care of the patient were reviewed by me and considered in my medical decision making (see chart for details). If possible, patient  and/or family made aware of any abnormal findings.  No results found. ____________________________________________    PROCEDURES  Procedure(s) performed: None  Procedures  Critical Care performed: None  ____________________________________________   INITIAL IMPRESSION / ASSESSMENT AND PLAN / ED COURSE  Pertinent labs & imaging results that were available during my care of the patient were reviewed by me and considered in my medical decision making (see chart for details).  Patient is here with essentially asymptomatic atrial fibrillation noted incidentally after procedure.  He is not having any symptoms.  His rate is not out of control, we are getting vitals on him now.  Patient not certain will need to stay in the hospital for this, we will talk to cardiology after we get some blood work and a TSH back.  He is in no acute distress.  ----------------------------------------- 1:59 PM on 11/08/2018 -----------------------------------------  Patient is now in sinus rhythm, he was seen  by Dr. Rockey Situ in the ER.  Very much appreciate the consult.  They will put him on a Holter monitor starting from the ER which they will follow-up with.  He did asked me to put the patient on metoprolol succinate 25 a day, he does not wish anticoagulation for this patient with a very low chads score, and they will follow closely in the outpatient setting.  Return precautions were given and understood.    ____________________________________________   FINAL CLINICAL IMPRESSION(S) / ED DIAGNOSES  Final diagnoses:  None      This chart was dictated using voice recognition software.  Despite best efforts to proofread,  errors can occur which can change meaning.      Schuyler Amor, MD 11/08/18 1359

## 2018-11-08 NOTE — Progress Notes (Addendum)
12-lead EKG performed: A fib RVR. 2mg  of Metoprolol given by Dr. Denton Lank, anesthesiologist. Pt denies any chest pain or shortness of breath; BP, oxygen saturations & RR all stable. Will continue to monitor.

## 2018-11-08 NOTE — H&P (Signed)
Lucilla Lame, MD [facility] [address], [place], [place] [address] Phone: [phone] Fax : [phone]  Primary Care Physician:  Glean Hess, MD Primary Gastroenterologist:  Dr. Allen Norris  Pre-Procedure History & Physical: HPI:  Ronald Fry is a 58 y.o. male is here for a screening colonoscopy.   Past Medical History:  Diagnosis Date  . Allergy   . Asthma    with allergy flares  . History of palpitations    none recently  . Hypertension    H/O. no meds approx 2 yrs    Past Surgical History:  Procedure Laterality Date  . NO PAST SURGERIES    . TREATMENT FISTULA ANAL      Prior to Admission medications   Medication Sig Start Date End Date Taking? Authorizing Provider  albuterol (PROAIR HFA) 108 (90 Base) MCG/ACT inhaler Inhale 2 puffs into the lungs every 6 (six) hours as needed for wheezing or shortness of breath. 10/22/18  Yes Glean Hess, MD  albuterol (PROVENTIL) (2.5 MG/3ML) 0.083% nebulizer solution Take 3 mLs (2.5 mg total) by nebulization every 6 (six) hours as needed for wheezing or shortness of breath. 03/20/17  Yes Glean Hess, MD  cetirizine (ZYRTEC) 10 MG tablet Take 10 mg by mouth daily.   Yes [provider]  fluticasone (FLONASE) 50 MCG/ACT nasal spray Place into both nostrils daily.   Yes [provider]  sildenafil (VIAGRA) 50 MG tablet Take 1 tablet (50 mg total) by mouth daily as needed for up to 30 days for erectile dysfunction. 10/21/18 11/20/18 Yes Glean Hess, MD  atorvastatin (LIPITOR) 10 MG tablet Take 1 tablet (10 mg total) by mouth daily. Patient not taking: Reported on 10/21/2018 10/09/17   Glean Hess, MD    Allergies as of 10/21/2018  . (No Known Allergies)    Family History  Problem Relation Age of Onset  . Stroke Mother 18  . Stroke Father 75    Social History   Socioeconomic History  . Marital status: Married    Spouse name: Not on file  . Number of children: Not on file  . Years  of education: Not on file  . Highest education level: Not on file  Occupational History  . Occupation: management  Social Needs  . Financial resource strain: Not on file  . Food insecurity:    Worry: Not on file    Inability: Not on file  . Transportation needs:    Medical: Not on file    Non-medical: Not on file  Tobacco Use  . Smoking status: Never Smoker  . Smokeless tobacco: Never Used  Substance and Sexual Activity  . Alcohol use: Yes    Alcohol/week: 1.0 standard drinks    Types: 1 Cans of beer per week  . Drug use: No  . Sexual activity: Not on file  Lifestyle  . Physical activity:    Days per week: Not on file    Minutes per session: Not on file  . Stress: Not on file  Relationships  . Social connections:    Talks on phone: Not on file    Gets together: Not on file    Attends religious service: Not on file    Active member of club or organization: Not on file    Attends meetings of clubs or organizations: Not on file    Relationship status: Not on file  . Intimate partner violence:    Fear of current or ex partner: Not on  file    Emotionally abused: Not on file    Physically abused: Not on file    Forced sexual activity: Not on file  Other Topics Concern  . Not on file  Social History Narrative  . Not on file    Review of Systems: See HPI, otherwise negative ROS  Physical Exam: BP 133/80   Pulse 77   Temp 98.1 F (36.7 C)   Ht 5\' 10"  (1.778 m)   Wt 87.1 kg   SpO2 98%   BMI 27.55 kg/m  General:   Alert,  pleasant and cooperative in NAD Head:  Normocephalic and atraumatic. Neck:  Supple; no masses or thyromegaly. Lungs:  Clear throughout to auscultation.    Heart:  Regular rate and rhythm. Abdomen:  Soft, nontender and nondistended. Normal bowel sounds, without guarding, and without rebound.   Neurologic:  Alert and  oriented x4;  grossly normal neurologically.  Impression/Plan: Ronald Fry is now here to undergo a screening colonoscopy.   Risks, benefits, and alternatives regarding colonoscopy have been reviewed with the patient.  Questions have been answered.  All parties agreeable.

## 2018-11-11 ENCOUNTER — Encounter: Payer: Self-pay | Admitting: Gastroenterology

## 2018-11-12 LAB — SURGICAL PATHOLOGY

## 2018-11-13 ENCOUNTER — Encounter: Payer: Self-pay | Admitting: Gastroenterology

## 2018-11-15 ENCOUNTER — Telehealth: Payer: Self-pay | Admitting: Internal Medicine

## 2018-11-15 NOTE — Telephone Encounter (Signed)
Error

## 2018-11-17 ENCOUNTER — Encounter: Payer: Self-pay | Admitting: Internal Medicine

## 2018-11-17 DIAGNOSIS — I4891 Unspecified atrial fibrillation: Secondary | ICD-10-CM | POA: Insufficient documentation

## 2018-11-17 DIAGNOSIS — Z8679 Personal history of other diseases of the circulatory system: Secondary | ICD-10-CM | POA: Insufficient documentation

## 2018-11-17 DIAGNOSIS — Z9889 Other specified postprocedural states: Secondary | ICD-10-CM | POA: Insufficient documentation

## 2018-11-18 ENCOUNTER — Ambulatory Visit: Payer: 59 | Admitting: Internal Medicine

## 2018-11-18 ENCOUNTER — Encounter: Payer: Self-pay | Admitting: Internal Medicine

## 2018-11-18 ENCOUNTER — Ambulatory Visit (INDEPENDENT_AMBULATORY_CARE_PROVIDER_SITE_OTHER): Payer: 59 | Admitting: Internal Medicine

## 2018-11-18 ENCOUNTER — Other Ambulatory Visit: Payer: Self-pay

## 2018-11-18 VITALS — BP 112/64 | HR 58 | Ht 70.0 in | Wt 196.0 lb

## 2018-11-18 DIAGNOSIS — R351 Nocturia: Secondary | ICD-10-CM

## 2018-11-18 DIAGNOSIS — F410 Panic disorder [episodic paroxysmal anxiety] without agoraphobia: Secondary | ICD-10-CM | POA: Diagnosis not present

## 2018-11-18 DIAGNOSIS — N401 Enlarged prostate with lower urinary tract symptoms: Secondary | ICD-10-CM

## 2018-11-18 DIAGNOSIS — I480 Paroxysmal atrial fibrillation: Secondary | ICD-10-CM

## 2018-11-18 DIAGNOSIS — E349 Endocrine disorder, unspecified: Secondary | ICD-10-CM | POA: Diagnosis not present

## 2018-11-18 DIAGNOSIS — G4733 Obstructive sleep apnea (adult) (pediatric): Secondary | ICD-10-CM | POA: Diagnosis not present

## 2018-11-18 MED ORDER — ALPRAZOLAM 0.25 MG PO TABS: 0.2500 mg | ORAL_TABLET | Freq: Every day | ORAL | 0 refills | Status: DC | PRN

## 2018-11-18 NOTE — Progress Notes (Signed)
Date:  11/18/2018   Name:  Ronald Fry   DOB:  November 07, 1960   MRN:  [id number]   Chief Complaint: Atrial Fibrillation (After colonoscopy was sent to hospital for afib. ); Sleep Apnea (Wants new sleeps study. Last one was 8 years ago. Had it done it [place]. ); and Benign Prostatic Hypertrophy (Wants to see urologist. Discussed at last appt. ) ED follow up.  Pt had his colonoscopy on 11/08/18 and after the procedure was found to be in afib.  He felt well but was sent to the ED.  He was seen by Dr. Rockey Situ who recommended a monitor, metoprolol 25 mg daily and outpatient follow up.  His CHADS score was zero so no anticoagulation was recommended.  While in the ED, he converted spontaneously to SR.  OSA - diagnosed in the past but used CPAP for only a few months. He had trouble getting the mask to fit. In view of the recent Afib, he needs to have testing again.  Elevated PSA - borderline high PSA last month.  Advised to repeat in 4 months to establish a trend.  He now says that he has a number of urological sx and would like to see Urology.  Low Testosterone - he has been on supplementation via injections in the past.  He took it mainly for low energy.  He has not been tested in a while but would like be checked out again.  He read of a correlation between Afib and low T.  Anxiety - he is having anxiety sx when he has to see his wife to deal with the children.  He finds himself having panic sx, racing heart and trouble sleeping.  He tried lexapro initially but woke with SOB and stopped it.  He does not feel depressed now but does have episodic anxiety.  Review of Systems  Constitutional: Negative for chills, fatigue and fever.  HENT: Negative for trouble swallowing.   Respiratory: Negative for cough, chest tightness, shortness of breath and wheezing.   Cardiovascular: Negative for chest pain, palpitations and leg swelling.  Gastrointestinal: Negative for abdominal pain.  Genitourinary: Positive  for difficulty urinating, frequency and urgency. Negative for discharge and hematuria.  Musculoskeletal: Negative for arthralgias.  Allergic/Immunologic: Negative for environmental allergies.  Neurological: Negative for dizziness, tremors and weakness.  Psychiatric/Behavioral: Negative for decreased concentration and dysphoric mood. The patient is nervous/anxious.     Patient Active Problem List   Diagnosis Date Noted  . Atrial fibrillation ([place]) 11/17/2018  . Major depression in partial remission ([place]) 09/17/2018  . Chronic pain of right ankle 09/17/2018  . Mild hyperlipidemia 10/07/2017  . Environmental and seasonal allergies 08/07/2016  . Hypotestosteronism 11/02/2015  . Intermittent palpitations 07/27/2015  . Gastroesophageal reflux disease 07/27/2015  . Asthma, mild intermittent 07/27/2015  . Vitamin D deficiency 07/27/2015  . Obesity, Class I, BMI 30-34.9 07/26/2015    No Known Allergies  Past Surgical History:  Procedure Laterality Date  . COLONOSCOPY WITH PROPOFOL N/A 11/08/2018   Procedure: COLONOSCOPY WITH BIOPSY;  Surgeon: Lucilla Lame, MD;  Location: [place];  Service: Endoscopy;  Laterality: N/A;  . NO PAST SURGERIES    . POLYPECTOMY N/A 11/08/2018   Procedure: POLYPECTOMY;  Surgeon: Lucilla Lame, MD;  Location: [place];  Service: Endoscopy;  Laterality: N/A;  . TREATMENT FISTULA ANAL      Social History   Tobacco Use  . Smoking status: Never Smoker  . Smokeless tobacco: Never Used  Substance Use Topics  .  Alcohol use: Yes    Alcohol/week: 1.0 standard drinks    Types: 1 Cans of beer per week  . Drug use: No     Medication list has been reviewed and updated.  Current Meds  Medication Sig  . albuterol (PROAIR HFA) 108 (90 Base) MCG/ACT inhaler Inhale 2 puffs into the lungs every 6 (six) hours as needed for wheezing or shortness of breath.  Marland Kitchen albuterol (PROVENTIL) (2.5 MG/3ML) 0.083% nebulizer solution Take 3 mLs (2.5 mg total) by  nebulization every 6 (six) hours as needed for wheezing or shortness of breath.  . cetirizine (ZYRTEC) 10 MG tablet Take 10 mg by mouth daily.  . fluticasone (FLONASE) 50 MCG/ACT nasal spray Place into both nostrils daily.  . metoprolol succinate (TOPROL XL) 25 MG 24 hr tablet Take 1 tablet (25 mg total) by mouth daily for 30 days.  . sildenafil (VIAGRA) 50 MG tablet Take 1 tablet (50 mg total) by mouth daily as needed for up to 30 days for erectile dysfunction.    PHQ 2/9 Scores 11/18/2018 10/21/2018 09/17/2018 10/02/2017  PHQ - 2 Score 0 0 6 0  PHQ- 9 Score 0 0 21 -   IPSS Questionnaire (AUA-7): Over the past month.   1)  How often have you had a sensation of not emptying your bladder completely after you finish urinating?  2 - Less than half the time  2)  How often have you had to urinate again less than two hours after you finished urinating? 2 - Less than half the time  3)  How often have you found you stopped and started again several times when you urinated?  3 - About half the time  4) How difficult have you found it to postpone urination?  4 - More than half the time  5) How often have you had a weak urinary stream?  5 - Almost always  6) How often have you had to push or strain to begin urination?  0 - Not at all  7) How many times did you most typically get up to urinate from the time you went to bed until the time you got up in the morning?  3 - 3 times  Total score:  0-7 mildly symptomatic  19 8-19 moderately symptomatic   20-35 severely symptomatic    Physical Exam Vitals signs and nursing note reviewed.  Constitutional:      General: He is not in acute distress.    Appearance: He is well-developed.  HENT:     Head: Normocephalic and atraumatic.  Eyes:     Pupils: Pupils are equal, round, and reactive to light.  Neck:     Musculoskeletal: Normal range of motion and neck supple.  Cardiovascular:     Rate and Rhythm: Normal rate and regular rhythm.     Pulses: Normal  pulses.  Pulmonary:     Effort: Pulmonary effort is normal. No respiratory distress.     Breath sounds: Normal breath sounds. No wheezing or rales.  Musculoskeletal: Normal range of motion.  Lymphadenopathy:     Cervical: No cervical adenopathy.  Skin:    General: Skin is warm and dry.     Findings: No rash.  Neurological:     Mental Status: He is alert and oriented to person, place, and time.  Psychiatric:        Attention and Perception: Attention normal.        Mood and Affect: Mood normal.  Behavior: Behavior normal.        Thought Content: Thought content normal.     Wt Readings from Last 3 Encounters:  11/18/18 196 lb (88.9 kg)  11/08/18 192 lb (87.1 kg)  11/08/18 192 lb (87.1 kg)    BP 112/64   Pulse (!) 58   Ht 5\' 10"  (1.778 m)   Wt 196 lb (88.9 kg)   SpO2 98%   BMI 28.12 kg/m   Assessment and Plan: 1. Paroxysmal atrial fibrillation (HCC) Now in SR by exam Continue metoprolol Send in [place] monitor once complete and then follow up with Cardiology  2. Hypotestosteronism Check levels so he can discuss with Urology - Testosterone, Free, Total, SHBG - Ambulatory referral to Urology  3. Benign prostatic hyperplasia with nocturia High AUA score - Ambulatory referral to Urology  4. OSA (obstructive sleep apnea) Will refer for sleep study and therapy if needed - Ambulatory referral to Sleep Studies  5. Panic disorder without agoraphobia Start low dose xanax only as needed for temporary use - ALPRAZolam (XANAX) 0.25 MG tablet; Take 1 tablet (0.25 mg total) by mouth daily as needed for anxiety.  Dispense: 20 tablet; Refill: 0   Partially dictated using Editor, commissioning. Any errors are unintentional.  Halina Maidens, MD [facility]  11/18/2018

## 2018-11-19 LAB — TESTOSTERONE, FREE, TOTAL, SHBG
Sex Hormone Binding: 47.4 nmol/L (ref 19.3–76.4)
Testosterone, Free: 14.3 pg/mL (ref 7.2–24.0)
Testosterone: 466 ng/dL (ref 264–916)

## 2018-11-26 ENCOUNTER — Telehealth: Payer: Self-pay | Admitting: *Deleted

## 2018-11-26 NOTE — Telephone Encounter (Signed)
Would refill the metoprolol Would set up a evisit for end of next week By then we should have results on his ZIO

## 2018-11-26 NOTE — Telephone Encounter (Signed)
Call placed to the patient as a ED follow up for atrial fibrillation. The patient stated that he mailed his ZIO monitor back a few days ago that was placed in the ED.  He had a follow up with his PCP on 11/18/2018 were he was noted to be in sinus rhythm.   He has since bought an apple watch to monitor his rhythm which has shown him to still be in normal sinus.   He is currently taking Metoprolol Succinate 25 mg daily but will need a refill if that is what he will remain on. He stated he is asymptomatic   He is willing to do a web visit or wait until the office is back to seeing patients.

## 2018-11-28 NOTE — Telephone Encounter (Signed)
Left voicemail message that I have some recommendations by provider that I would like to review with him. Advised that I would try to call him back and to please answer phone if it shows up unknown caller or No caller ID.

## 2018-11-29 DIAGNOSIS — I480 Paroxysmal atrial fibrillation: Secondary | ICD-10-CM | POA: Diagnosis not present

## 2018-12-02 ENCOUNTER — Telehealth: Payer: Self-pay | Admitting: Cardiovascular Disease

## 2018-12-02 NOTE — Telephone Encounter (Signed)
Evisit Consent Pending to Email [email or web address]

## 2018-12-02 NOTE — Telephone Encounter (Signed)
Patient scheduled for Evisit 4/9 with [facility]

## 2018-12-04 ENCOUNTER — Telehealth: Payer: Self-pay

## 2018-12-04 NOTE — Telephone Encounter (Signed)
Spoke with patient. Made sure he knew exactly what to look for at his appointment time tomorrow (12/05/18) He did not want to do a "doxy trial"

## 2018-12-04 NOTE — Progress Notes (Addendum)
Virtual Visit via Video Note   This visit type was conducted due to national recommendations for restrictions regarding the COVID-19 Pandemic (e.g. social distancing) in an effort to limit this patient's exposure and mitigate transmission in our community.  Due to her co-morbid illnesses, this patient is at least at moderate risk for complications without adequate follow up.  This format is felt to be most appropriate for this patient at this time.  All issues noted in this document were discussed and addressed.  A limited physical exam was performed with this format.  Please refer to the patient's chart for her consent to telehealth for [facility].    Date:  12/04/2018   ID:  Ronald Fry, DOB 12/04/1960, MRN [id number]  Patient Location:  [place] [address]   Provider location:   [facility], [place] office  PCP:  Glean Hess, MD  Cardiologist:  Patsy [place]  Chief Complaint:  Atrial fib, tachycardia   History of Present Illness:    Ronald Fry is a 58 y.o. male who presents via audio/video conferencing for a telehealth visit today.   The patient does not symptoms concerning for COVID-19 infection (fever, chills, cough, or new SHORTNESS OF BREATH).   Patient has a past medical history of  palpitations, asthma, anxiety  Recently presented to the hospital for routine colonoscopy,  noted to be in atrial fibrillation following the procedure  uneventful colonoscopy with several polyps removed by Dr. Allen Norris Following the procedure noted to have elevated heart rate greater than 100 bpm,  EKG obtained showing atrial fibrillation with RVR In the emergency room he was given metoprolol tartrate 25 mg x 1 Lab work was checked He did convert to normal sinus rhythm in the emergency room rate in the 60s He presents today for further discussion of his paroxysmal atrial fibrillation  At the time of discharge from the hospital it was felt  that his Atrial fibrillation with RVR Appeared to be isolated episode following colon prep followed by anesthesia for colonoscopy,  Converting to normal sinus rhythm in the emergency room after metoprolol tartrate CADS VASC appears to be 0.    Numbers do not indicate history of hypertension --Recommend metoprolol succinate 25 mg daily -Discussion concerning monitoring pulse rate as outpatient for atrial fibrillation burden Discussed various options with him including manually checking pulse himself, using his watch, phone or other devices or a event monitor He prefers to have a event monitor placed. He was not placed on anticoagulation and recommended he take metoprolol succinate 25 daily  Event monitor showed narrow complex tachycardia concerning for SVT, unable to exclude atrial flutter Results discussed with him in detail on today's call  On call today he is not taking metoprolol succinate Reports having episode of atrial fibrillation Lasted for several hours April 4th, on apple watch documented the rhythm Got on elipital, atrial fib went away Attributes getting out of atrial fibrillation to being on the elliptical Again is not taking metoprolol  BP: 119/72 without metoprolol Would like to try alternate medication Has been doing some reading and would like flecainide   Prior CV studies:   The following studies were reviewed today:    Past Medical History:  Diagnosis Date  . Allergy   . Asthma    with allergy flares  . History of palpitations    none recently  . Hypertension    H/O. no meds approx 2 yrs   Past Surgical History:  Procedure Laterality Date  . COLONOSCOPY WITH PROPOFOL N/A 11/08/2018   Procedure: COLONOSCOPY WITH BIOPSY;  Surgeon: Lucilla Lame, MD;  Location: [facility];  Service: Endoscopy;  Laterality: N/A;  . NO PAST SURGERIES    . POLYPECTOMY N/A 11/08/2018   Procedure: POLYPECTOMY;  Surgeon: Lucilla Lame, MD;  Location: [place];   Service: Endoscopy;  Laterality: N/A;  . TREATMENT FISTULA ANAL       No outpatient medications have been marked as taking for the 12/05/18 encounter (Appointment) with Minna Merritts, MD.     Allergies:   Patient has no known allergies.   Social History   Tobacco Use  . Smoking status: Never Smoker  . Smokeless tobacco: Never Used  Substance Use Topics  . Alcohol use: Yes    Alcohol/week: 1.0 standard drinks    Types: 1 Cans of beer per week  . Drug use: No     Current Outpatient Medications on File Prior to Visit  Medication Sig Dispense Refill  . albuterol (PROAIR HFA) 108 (90 Base) MCG/ACT inhaler Inhale 2 puffs into the lungs every 6 (six) hours as needed for wheezing or shortness of breath. 18 g 5  . albuterol (PROVENTIL) (2.5 MG/3ML) 0.083% nebulizer solution Take 3 mLs (2.5 mg total) by nebulization every 6 (six) hours as needed for wheezing or shortness of breath. 150 mL 1  . ALPRAZolam (XANAX) 0.25 MG tablet Take 1 tablet (0.25 mg total) by mouth daily as needed for anxiety. 20 tablet 0  . cetirizine (ZYRTEC) 10 MG tablet Take 10 mg by mouth daily.    . fluticasone (FLONASE) 50 MCG/ACT nasal spray Place into both nostrils daily.    . metoprolol succinate (TOPROL XL) 25 MG 24 hr tablet Take 1 tablet (25 mg total) by mouth daily for 30 days. 30 tablet 0  . sildenafil (VIAGRA) 50 MG tablet Take 1 tablet (50 mg total) by mouth daily as needed for up to 30 days for erectile dysfunction. 6 tablet 12   No current facility-administered medications on file prior to visit.      Family Hx: The patient's family history includes Stroke (age of onset: 16) in his father and mother.  ROS:   Please see the history of present illness.    Review of Systems  Constitutional: Negative.   Respiratory: Negative.   Cardiovascular: Positive for palpitations.  Gastrointestinal: Negative.   Musculoskeletal: Negative.   Neurological: Negative.   Psychiatric/Behavioral: Negative.   All  other systems reviewed and are negative.     Labs/Other Tests and Data Reviewed:    Recent Labs: 10/21/2018: ALT 20 11/08/2018: BUN 10; Creatinine, Ser 0.77; Hemoglobin 16.4; Platelets 219; Potassium 4.0; Sodium 140; TSH 1.758   Recent Lipid Panel Lab Results  Component Value Date/Time   CHOL 132 10/21/2018 02:43 PM   TRIG 98 10/21/2018 02:43 PM   HDL 44 10/21/2018 02:43 PM   CHOLHDL 3.0 10/21/2018 02:43 PM   LDLCALC 68 10/21/2018 02:43 PM    Wt Readings from Last 3 Encounters:  11/18/18 196 lb (88.9 kg)  11/08/18 192 lb (87.1 kg)  11/08/18 192 lb (87.1 kg)     Exam:    Vital Signs: Vital signs as detailed above in HPI Detailed above BP: 119/72 without metoprolol, pulse 70s  respirations 16 Well nourished, well developed male in no acute distress. Constitutional:  oriented to person, place, and time. No distress.    ASSESSMENT & PLAN:    Atrial fibrillation with RVR  isolated episode in the hospital following colon prep followed by anesthesia for colonoscopy,  CADS VASC appears to be 0.   We recommended he stay on metoprolol succinate 25 mg daily He is not taking metoprolol, everything he was reading reports that he does not want to take beta-blockers  Zio monitor discussed with him in detail Showing short runs of narrow complex tachycardia longest 15 seconds -Discussed various strategies with him including taking calcium channel blocker After further discussion we will start diltiazem extended release 120 mg daily He would again like flecainide to take as needed, pill in the pocket approach We have prescribed flecainide 50 twice daily as needed for atrial fibrillation episodes -He is declining anticoagulation. He is aware of risk and benefit including CVA.  S/p colonoscopy Polyps removed Has follow-up with Dr. Allen Norris  Home stressors Reports he is getting a separation from his wife after 23 years Wonders if this could have contributed to symptoms as detailed  above    COVID-19 Education: The signs and symptoms of COVID-19 were discussed with the patient and how to seek care for testing (follow up with PCP or arrange E-visit).  The importance of social distancing was discussed today.  Patient Risk:   After full review of this patients clinical status, I feel that they are at least moderate risk at this time.  Time:   Today, I have spent 25 minutes with the patient with telehealth technology discussing paroxysmal atrial fibrillation strategies, management, medication changes made.     Medication Adjustments/Labs and Tests Ordered: Current medicines are reviewed at length with the patient today.  Concerns regarding medicines are outlined above.   Tests Ordered: No tests ordered   Medication Changes: No changes made   Disposition: Follow-up in 6 months   Signed, Ida Rogue, MD  12/04/2018 6:14 PM    [place] Office [address] [place] [address], [place], [place] [address]

## 2018-12-05 ENCOUNTER — Telehealth (INDEPENDENT_AMBULATORY_CARE_PROVIDER_SITE_OTHER): Payer: 59 | Admitting: Cardiovascular Disease

## 2018-12-05 ENCOUNTER — Telehealth: Payer: Self-pay

## 2018-12-05 ENCOUNTER — Other Ambulatory Visit: Payer: Self-pay

## 2018-12-05 DIAGNOSIS — J4520 Mild intermittent asthma, uncomplicated: Secondary | ICD-10-CM | POA: Diagnosis not present

## 2018-12-05 DIAGNOSIS — E785 Hyperlipidemia, unspecified: Secondary | ICD-10-CM

## 2018-12-05 DIAGNOSIS — I480 Paroxysmal atrial fibrillation: Secondary | ICD-10-CM | POA: Diagnosis not present

## 2018-12-05 MED ORDER — DILTIAZEM HCL ER COATED BEADS 120 MG PO CP24: 120.0000 mg | ORAL_CAPSULE | Freq: Every day | ORAL | 3 refills | Status: DC

## 2018-12-05 MED ORDER — FLECAINIDE ACETATE 50 MG PO TABS: 50.0000 mg | ORAL_TABLET | Freq: Two times a day (BID) | ORAL | 0 refills | Status: DC | PRN

## 2018-12-05 NOTE — Telephone Encounter (Signed)
Patient wanted me to make Dr. Rockey Situ aware he does not have good cell phone service at his home.  He is scheduled for a video visit today at 11:20 and if the video does not work to call this number [phone]

## 2018-12-05 NOTE — Patient Instructions (Addendum)
Medication Instructions:  Your physician has recommended you make the following change in your medication:  1. START Diltiazem ER 120 mg Once daily for atrial fibrillation 2. START Flecainide 50 mg Twice a day as needed for atrial fibrillation. 3. STOP Metoprolol  If you need a refill on your cardiac medications before your next appointment, please call your pharmacy.    Lab work: No new labs needed   If you have labs (blood work) drawn today and your tests are completely normal, you will receive your results only by: Marland Kitchen MyChart Message (if you have MyChart) OR . A paper copy in the mail If you have any lab test that is abnormal or we need to change your treatment, we will call you to review the results.   Testing/Procedures: No new testing needed   Follow-Up: At [facility], you and your health needs are our priority.  As part of our continuing mission to provide you with exceptional heart care, we have created designated Provider Care Teams.  These Care Teams include your primary Cardiologist (physician) and Advanced Practice Providers (APPs -  Physician Assistants and Nurse Practitioners) who all work together to provide you with the care you need, when you need it.  . You will need a follow up appointment in 1 month  . Providers on your designated Care Team:   . Murray Hodgkins, NP . Christell Faith, PA-C . Marrianne Mood, PA-C  Any Other Special Instructions Will Be Listed Below (If Applicable).  For educational health videos Log in to : [email or web address] Or : [email or web address], password : [email or web address]

## 2018-12-05 NOTE — Telephone Encounter (Signed)
Patient seen provider today via virtual visit. Closing encounter.

## 2018-12-05 NOTE — Telephone Encounter (Signed)
lmov Patient will need 1 month follow up  Telephone visit

## 2018-12-23 MED FILL — DILTIAZEM 24HR CD 120 MG CA: 120 | 90 days supply | Qty: 90 | Fill #0

## 2019-01-02 ENCOUNTER — Telehealth: Payer: Self-pay

## 2019-01-02 NOTE — Telephone Encounter (Signed)

## 2019-01-07 NOTE — Progress Notes (Signed)
Virtual Visit via Video Note   This visit type was conducted due to national recommendations for restrictions regarding the COVID-19 Pandemic (e.g. social distancing) in an effort to limit this patient's exposure and mitigate transmission in our community.  Due to her co-morbid illnesses, this patient is at least at moderate risk for complications without adequate follow up.  This format is felt to be most appropriate for this patient at this time.  All issues noted in this document were discussed and addressed.  A limited physical exam was performed with this format.  Please refer to the patient's chart for her consent to telehealth for [facility].    Date:  01/07/2019   ID:  Ronald Fry, DOB Jun 25, 1961, MRN [id number]  Patient Location:  [place] [address]   Provider location:   Ronald Fry, [place] office  PCP:  Ronald Hess, MD  Cardiologist:  Ronald Fry  Chief Complaint:  Atrial fib, tachycardia   History of Present Illness:    Ronald Fry is a 58 y.o. male who presents via audio/video conferencing for a telehealth visit today.   The patient does not symptoms concerning for COVID-19 infection (fever, chills, cough, or new SHORTNESS OF BREATH).   Patient has a past medical history of  palpitations, asthma, anxiety  Recently presented to the hospital for routine colonoscopy,  noted to be in atrial fibrillation following the procedure  uneventful colonoscopy with several polyps removed by Dr. Allen Fry Following the procedure noted to have elevated heart rate greater than 100 bpm,  EKG obtained showing atrial fibrillation with RVR In the emergency room he was given metoprolol tartrate 25 mg x 1 Lab work was checked He did convert to normal sinus rhythm in the emergency room rate in the 60s He presents today for further discussion of his paroxysmal atrial fibrillation  In follow-up today reports he is doing relatively well  On his last clinic visit did not want metoprolol, preferred to take flecainide as needed Has a apple watch and documenting his rhythm Continues to have paroxysmal atrial fibrillation once every 8 to 10 days Is not very symptomatic and prefers to take diltiazem on a regular basis with flecainide as needed Does not want anticoagulation  Blood pressure stable  Also having normal sinus rhythm with APCs in bigeminal pattern This was documented on his watch, no significant symptoms  Blood pressure measurements typically in the 938 up to 101 systolic range  Other past medical history reviewed At the time of discharge from the hospital it was felt that his Atrial fibrillation with RVR Appeared to be isolated episode following colon prep followed by anesthesia for colonoscopy,  Converting to normal sinus rhythm in the emergency room after metoprolol tartrate CADS VASC appears to be 0.    Previous event monitor showed narrow complex tachycardia concerning for SVT, unable to exclude atrial flutter   Prior CV studies:   The following studies were reviewed today:  Echo and stress test done  7 to 10 years ago in [place]  Past Medical History:  Diagnosis Date  . Allergy   . Asthma    with allergy flares  . History of palpitations    none recently  . Hypertension    H/O. no meds approx 2 yrs   Past Surgical History:  Procedure Laterality Date  . COLONOSCOPY WITH PROPOFOL N/A 11/08/2018   Procedure: COLONOSCOPY WITH BIOPSY;  Surgeon: Ronald Lame, MD;  Location: [place] [facility];  Service: Endoscopy;  Laterality: N/A;  . NO PAST SURGERIES    . POLYPECTOMY N/A 11/08/2018   Procedure: POLYPECTOMY;  Surgeon: Ronald Lame, MD;  Location: [facility];  Service: Endoscopy;  Laterality: N/A;  . TREATMENT FISTULA ANAL       No outpatient medications have been marked as taking for the 01/08/19 encounter (Appointment) with Ronald Merritts, MD.     Allergies:   Patient has no known  allergies.   Social History   Tobacco Use  . Smoking status: Never Smoker  . Smokeless tobacco: Never Used  Substance Use Topics  . Alcohol use: Yes    Alcohol/week: 1.0 standard drinks    Types: 1 Cans of beer per week  . Drug use: No     Current Outpatient Medications on File Prior to Visit  Medication Sig Dispense Refill  . albuterol (PROAIR HFA) 108 (90 Base) MCG/ACT inhaler Inhale 2 puffs into the lungs every 6 (six) hours as needed for wheezing or shortness of breath. 18 g 5  . albuterol (PROVENTIL) (2.5 MG/3ML) 0.083% nebulizer solution Take 3 mLs (2.5 mg total) by nebulization every 6 (six) hours as needed for wheezing or shortness of breath. 150 mL 1  . ALPRAZolam (XANAX) 0.25 MG tablet Take 1 tablet (0.25 mg total) by mouth daily as needed for anxiety. 20 tablet 0  . cetirizine (ZYRTEC) 10 MG tablet Take 10 mg by mouth daily.    Marland Kitchen diltiazem (CARDIZEM CD) 120 MG 24 hr capsule Take 1 capsule (120 mg total) by mouth daily. 30 capsule 3  . flecainide (TAMBOCOR) 50 MG tablet Take 1 tablet (50 mg total) by mouth 2 (two) times daily as needed (As needed for atrial fibrillation). 60 tablet 0  . fluticasone (FLONASE) 50 MCG/ACT nasal spray Place into both nostrils daily.    . sildenafil (VIAGRA) 50 MG tablet Take 1 tablet (50 mg total) by mouth daily as needed for up to 30 days for erectile dysfunction. 6 tablet 12   No current facility-administered medications on file prior to visit.      Family Hx: The patient's family history includes Stroke (age of onset: 66) in his father and mother.  ROS:   Please see the history of present illness.    Review of Systems  Constitutional: Negative.   HENT: Negative.   Respiratory: Negative.   Cardiovascular: Negative.   Gastrointestinal: Negative.   Musculoskeletal: Negative.   Neurological: Negative.   Psychiatric/Behavioral: Negative.   All other systems reviewed and are negative.     Labs/Other Tests and Data Reviewed:     Recent Labs: 10/21/2018: ALT 20 11/08/2018: BUN 10; Creatinine, Ser 0.77; Hemoglobin 16.4; Platelets 219; Potassium 4.0; Sodium 140; TSH 1.758   Recent Lipid Panel Lab Results  Component Value Date/Time   CHOL 132 10/21/2018 02:43 PM   TRIG 98 10/21/2018 02:43 PM   HDL 44 10/21/2018 02:43 PM   CHOLHDL 3.0 10/21/2018 02:43 PM   LDLCALC 68 10/21/2018 02:43 PM    Wt Readings from Last 3 Encounters:  11/18/18 196 lb (88.9 kg)  11/08/18 192 lb (87.1 kg)  11/08/18 192 lb (87.1 kg)     Exam:    Vital Signs: Vital signs as detailed above in HPI  BP: 120/70,   pulse 70s  respirations 16 Well nourished, well developed male in no acute distress. Constitutional:  oriented to person, place, and time. No distress.    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   Long discussion concerning risk and benefit of anticoagulation Continue flecainide 50 twice daily as needed for atrial fibrillation episodes He was down his diltiazem Blood pressure stable, no orthostasis symptoms -He is declining anticoagulation. He is aware of risk and benefit including CVA.  Home stressors Reports he is getting a separation from his wife after 23 years Wonders if this could have contributed to symptoms as detailed above    COVID-19 Education: The signs and symptoms of COVID-19 were discussed with the patient and how to seek care for testing (follow up with PCP or arrange E-visit).  The importance of social distancing was discussed today.  Patient Risk:   After full review of this patients clinical status, I feel that they are at least moderate risk at this time.  Time:   Today, I have spent 25 minutes with the patient with telehealth technology discussing paroxysmal atrial fibrillation strategies, management, medication changes made.     Medication Adjustments/Labs and Tests Ordered: Current medicines are reviewed at length with the patient today.  Concerns  regarding medicines are outlined above.   Tests Ordered: No tests ordered   Medication Changes: No changes made   Disposition: Follow-up in 12 months   Signed, Ida Rogue, MD  01/07/2019 3:34 PM    [place] Office [address] [place] [address], [place], [place] [address]

## 2019-01-08 ENCOUNTER — Other Ambulatory Visit: Payer: Self-pay

## 2019-01-08 ENCOUNTER — Telehealth (INDEPENDENT_AMBULATORY_CARE_PROVIDER_SITE_OTHER): Payer: 59 | Admitting: Cardiovascular Disease

## 2019-01-08 VITALS — Ht 68.0 in | Wt 192.0 lb

## 2019-01-08 DIAGNOSIS — E785 Hyperlipidemia, unspecified: Secondary | ICD-10-CM | POA: Diagnosis not present

## 2019-01-08 DIAGNOSIS — F324 Major depressive disorder, single episode, in partial remission: Secondary | ICD-10-CM | POA: Diagnosis not present

## 2019-01-08 DIAGNOSIS — I480 Paroxysmal atrial fibrillation: Secondary | ICD-10-CM | POA: Diagnosis not present

## 2019-01-08 NOTE — Patient Instructions (Signed)

## 2019-01-14 ENCOUNTER — Ambulatory Visit: Payer: 59 | Admitting: Urology

## 2019-02-01 IMAGING — CR DG ANKLE COMPLETE 3+V*R*
3 series · 3 of 3 positions shown · non-contrast
Comparison: None.

CLINICAL DATA: Chronic right ankle pain.  No recent injury.

EXAM:
RIGHT ANKLE - COMPLETE 3+ VIEW

[ankle ap]
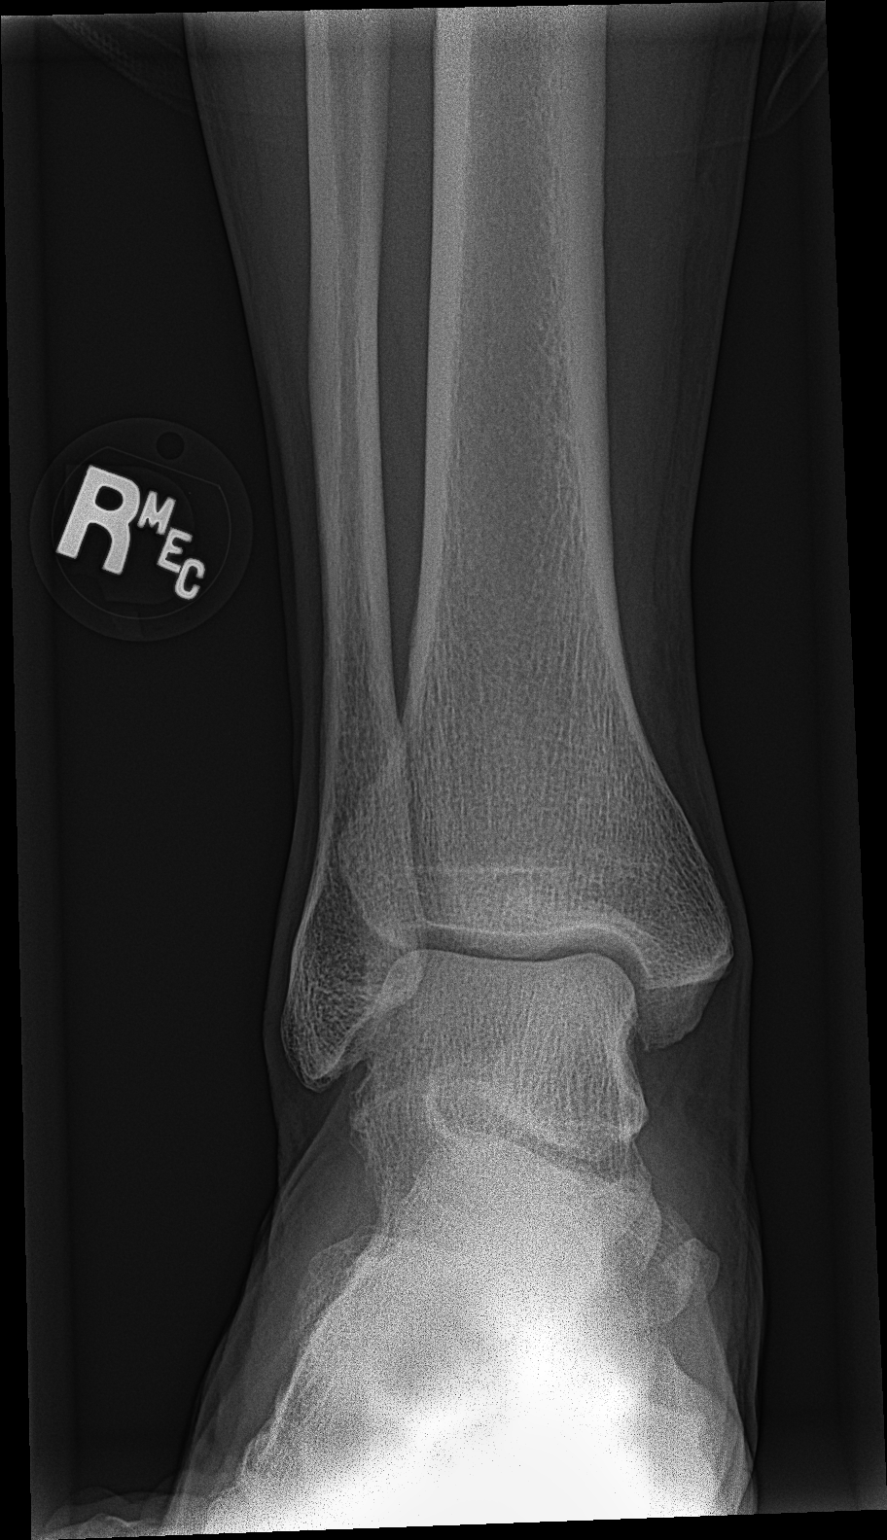

[ankle obl]
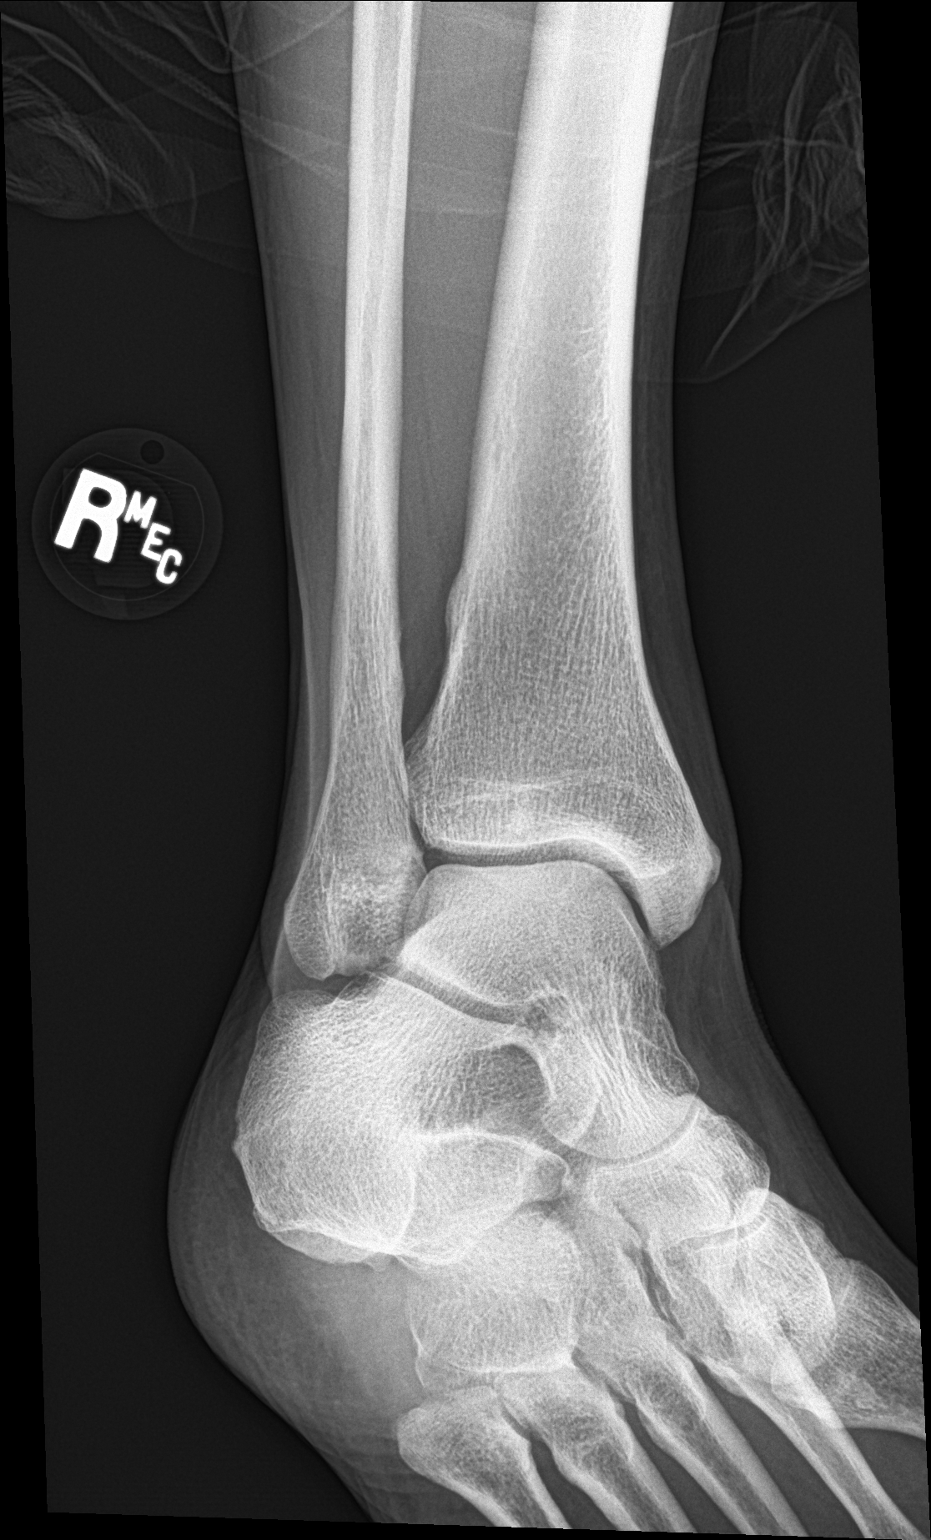

[ankle lat]
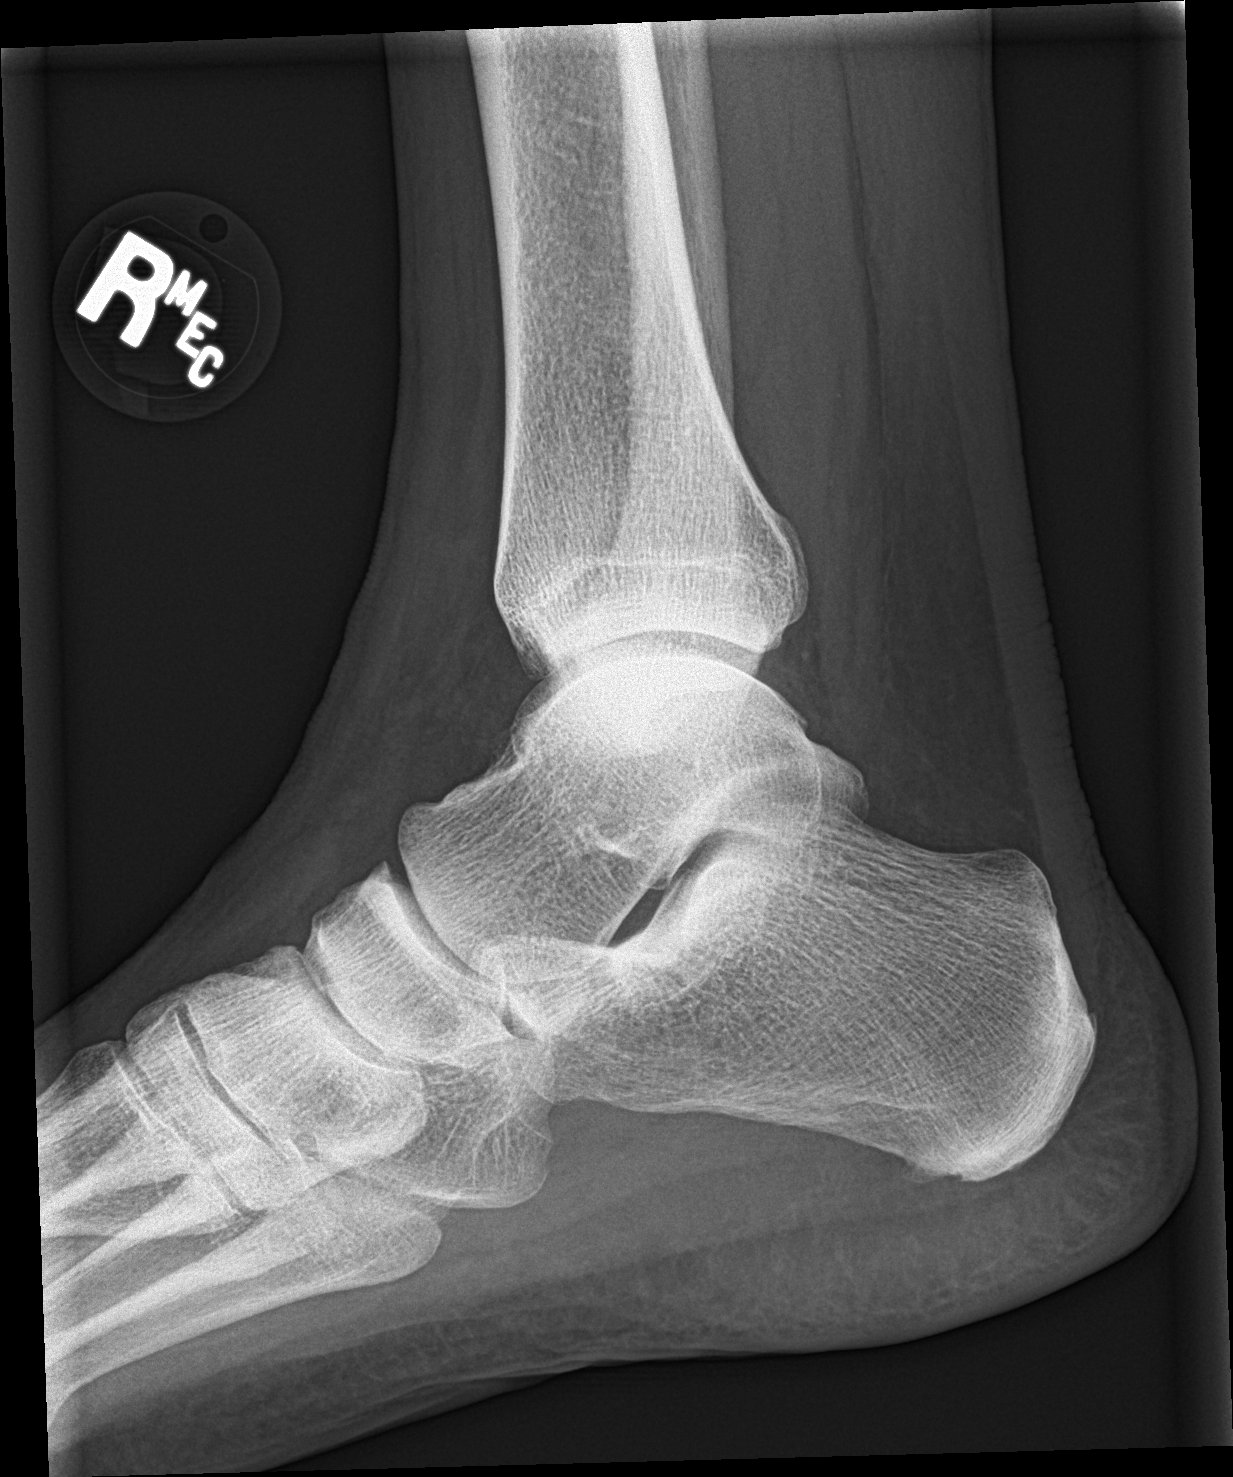

[3 of 3 positions shown; findings below may reference images not displayed]

FINDINGS: There is no evidence of fracture, dislocation, or joint effusion.
There is no evidence of arthropathy or other focal bone abnormality.
Soft tissues are unremarkable.
IMPRESSION: Normal exam.

## 2019-02-06 ENCOUNTER — Encounter: Payer: Self-pay | Admitting: Internal Medicine

## 2019-02-07 ENCOUNTER — Encounter: Payer: Self-pay | Admitting: Internal Medicine

## 2019-02-07 ENCOUNTER — Other Ambulatory Visit: Payer: Self-pay | Admitting: Internal Medicine

## 2019-02-07 DIAGNOSIS — N529 Male erectile dysfunction, unspecified: Secondary | ICD-10-CM

## 2019-02-07 MED ORDER — SILDENAFIL CITRATE 100 MG PO TABS: 50.0000 mg | ORAL_TABLET | Freq: Every day | ORAL | 12 refills | Status: DC | PRN

## 2019-02-07 NOTE — Telephone Encounter (Signed)
Please advise patient Rx question?

## 2019-02-10 ENCOUNTER — Other Ambulatory Visit: Payer: Self-pay

## 2019-02-10 DIAGNOSIS — R972 Elevated prostate specific antigen [PSA]: Secondary | ICD-10-CM | POA: Diagnosis not present

## 2019-02-11 ENCOUNTER — Other Ambulatory Visit: Payer: Self-pay

## 2019-02-11 DIAGNOSIS — R972 Elevated prostate specific antigen [PSA]: Secondary | ICD-10-CM

## 2019-02-11 LAB — PSA: Prostate Specific Ag, Serum: 3.9 ng/mL (ref 0.0–4.0)

## 2019-02-11 NOTE — Progress Notes (Signed)
psa

## 2019-02-21 ENCOUNTER — Ambulatory Visit: Payer: 59 | Admitting: Urology

## 2019-03-17 NOTE — Telephone Encounter (Signed)
Call to patient to discuss mychart message received from patient.   Hx of a fib, on daily diltiazem, flecainide as needed.   Last seen virtually by Dr. Rockey Situ 5/13, he has since had increase in episodes.  Monitors HR with apple watch. Pt reports that he woke up in a fin last Thursday, Friday and sat. He took PRN Flecainide on all of those days. He reports that MD told him if he was needing meds more often then he would likely need a a stress test before being prescribed Flecainide daily. Highest HR on sat was in the 90s. He denies any sx. No cxp or SOB. No BP recordings available.     Call routed to Dr. Rockey Situ to further advise.

## 2019-03-18 ENCOUNTER — Other Ambulatory Visit: Payer: Self-pay

## 2019-03-18 ENCOUNTER — Ambulatory Visit (INDEPENDENT_AMBULATORY_CARE_PROVIDER_SITE_OTHER): Payer: 59 | Admitting: Urology

## 2019-03-18 ENCOUNTER — Other Ambulatory Visit
Admission: RE | Admit: 2019-03-18 | Discharge: 2019-03-18 | Disposition: A | Discharge status: Home / Self Care | Payer: 59 | Attending: Urology | Admitting: Urology

## 2019-03-18 ENCOUNTER — Encounter: Payer: Self-pay | Admitting: Urology

## 2019-03-18 VITALS — BP 119/80 | HR 62 | Ht 69.0 in | Wt 190.0 lb

## 2019-03-18 DIAGNOSIS — N529 Male erectile dysfunction, unspecified: Secondary | ICD-10-CM | POA: Diagnosis not present

## 2019-03-18 DIAGNOSIS — R351 Nocturia: Secondary | ICD-10-CM | POA: Diagnosis not present

## 2019-03-18 DIAGNOSIS — R972 Elevated prostate specific antigen [PSA]: Secondary | ICD-10-CM

## 2019-03-18 DIAGNOSIS — N401 Enlarged prostate with lower urinary tract symptoms: Secondary | ICD-10-CM

## 2019-03-18 MED ORDER — APIXABAN 5 MG PO TABS: 5.0000 mg | ORAL_TABLET | Freq: Two times a day (BID) | ORAL | 1 refills | Status: DC

## 2019-03-18 MED ORDER — TADALAFIL 20 MG PO TABS: 20.0000 mg | ORAL_TABLET | Freq: Every day | ORAL | 3 refills | Status: DC | PRN

## 2019-03-18 MED ORDER — FLECAINIDE ACETATE 50 MG PO TABS: 50.0000 mg | ORAL_TABLET | Freq: Two times a day (BID) | ORAL | 1 refills | Status: DC

## 2019-03-18 NOTE — Progress Notes (Addendum)
03/18/19 2:23 PM   Ronald Fry Jun 04, 1961 [id number]  Referring provider: Glean Hess, MD [address] [place],  [place] [address]  CC: PSA screening, ED, BPH  HPI: I saw Ronald Fry in urology clinic today in consultation from Dr. Army Melia for PSA screening, ED, BPH.  He is a 58 year old male with past medical history notable for sleep apnea not on CPAP, and atrial fibrillation with plan to start Eliquis shortly.  He is here to discuss his recent values from PSA screening.  His baseline PSA was 2.5 in February 2019, then mildly elevated to 4.2 in February 2020.  This was rechecked in June 2020 and was 3.9.  There was no free to total ratio.  He denies any family history of prostate cancer.  He denies any gross hematuria.  He has mild intermittent urinary symptoms of nocturia 2-4 times per night, and urinary frequency during the day with some weak stream.  He is minimally bothered by his symptoms.  He also has erectile dysfunction, and has previously been on sildenafil, however he is interested in transitioning to tadalafil.  There are no aggravating or alleviating factors.  He denies any prior abdominal surgeries.   PMH: Past Medical History:  Diagnosis Date  . Allergy   . Asthma    with allergy flares  . History of palpitations    none recently  . Hypertension    H/O. no meds approx 2 yrs    Surgical History: Past Surgical History:  Procedure Laterality Date  . COLONOSCOPY WITH PROPOFOL N/A 11/08/2018   Procedure: COLONOSCOPY WITH BIOPSY;  Surgeon: Lucilla Lame, MD;  Location: [facility];  Service: Endoscopy;  Laterality: N/A;  . NO PAST SURGERIES    . POLYPECTOMY N/A 11/08/2018   Procedure: POLYPECTOMY;  Surgeon: Lucilla Lame, MD;  Location: [facility];  Service: Endoscopy;  Laterality: N/A;  . TREATMENT FISTULA ANAL      Allergies: No Known Allergies  Family History: Family History  Problem Relation Age of Onset  . Stroke Mother 29   . Stroke Father 61    Social History:  reports that he has never smoked. He has never used smokeless tobacco. He reports current alcohol use of about 1.0 standard drinks of alcohol per week. He reports that he does not use drugs.  ROS: Please see flowsheet from today's date for complete review of systems.  Physical Exam: BP 119/80   Pulse 62   Ht 5\' 9"  (1.753 m)   Wt 190 lb (86.2 kg)   BMI 28.06 kg/m    Constitutional:  Alert and oriented, No acute distress. Cardiovascular: No clubbing, cyanosis, or edema. Respiratory: Normal respiratory effort, no increased work of breathing. GI: Abdomen is soft, nontender, nondistended, no abdominal masses GU: No CVA tenderness DRE: 40g,smooth, no nodules or masses Lymph: No cervical or inguinal lymphadenopathy. Skin: No rashes, bruises or suspicious lesions. Neurologic: Grossly intact, no focal deficits, moving all 4 extremities. Psychiatric: Normal mood and affect.  Laboratory Data: Reviewed, see HPI for PSA history  Pertinent Imaging: None to review  Assessment & Plan:   In summary, the patient is a 58 year old male with mild urinary symptoms of BPH, history of mildly elevated PSA with benign DRE, and erectile dysfunction responsive to PDE 5 inhibitors.   We reviewed the implications of an elevated PSA and the uncertainty surrounding it. In general, a man's PSA increases with age and is produced by both normal and cancerous prostate tissue. The differential diagnosis  for elevated PSA includes BPH, prostate cancer, infection, recent intercourse/ejaculation, recent urethroscopic manipulation (foley placement/cystoscopy) or trauma, and prostatitis.   Management of an elevated PSA can include observation or prostate biopsy and we discussed this in detail. Our goal is to detect clinically significant prostate cancers, and manage with either active surveillance, surgery, or radiation for localized disease. Risks of prostate biopsy include  bleeding, infection (including life threatening sepsis), pain, and lower urinary symptoms. Hematuria, hematospermia, and blood in the stool are all common after biopsy and can persist up to 4 weeks.   -Repeat PSA with free/total ratio today, call with results.  Pursue biopsy if remains elevated and percentage free concerning.  If returns to normal, can continue yearly PSA screening -Trial of tadalafil 10 mg on demand for ED, risks and benefits discussed -Continue to monitor symptoms regarding BPH, consider Flomax in the future if worsening symptoms  Billey Co, [place] [address] [place], [place] [address] [phone]

## 2019-03-18 NOTE — Telephone Encounter (Signed)
Spoke with the pt and made him aware of Dr. Donivan Scull recommendation.   March 18, 2019 Ronald Merritts, MD to [facility] Triage   7:52 AM Yes I would consider starting flecainide 50 twice daily with the diltiazem  Would also consider Eliquis 5 twice daily for stroke prevention given the frequency of his episodes  With coupon the cost of Eliquis would be very affordable  TG   Pt is agreeable to start Eliquis and Flecainide. Rx sent to the pt pharmacy. Eliquis $10 co-pay coupon card mailed to the pt. Adv the pt to activate the coupon before he picks ups the medication. Pt has no questions of concerns at this time. Pt adv to contact the office if needed.

## 2019-03-18 NOTE — Patient Instructions (Signed)
Prostate Cancer Screening  The prostate is a walnut-sized gland that is located below the bladder and in front of the rectum in males. The function of the prostate (prostate gland) is to add fluid to semen during ejaculation. Prostate cancer is the second most common type of cancer in men. A screening test for cancer is a test that is done before cancer symptoms start. Screening can help to identify cancer at an early stage, when the cancer can be treated more easily. The recommended prostate cancer screening test is a blood test called the prostate-specific antigen (PSA) test. PSA is a protein that is made in the prostate. As you age, your prostate naturally produces more PSA. Abnormally high PSA levels may be caused by:  Prostate cancer.  An enlarged prostate that is not caused by cancer (benign prostatic hyperplasia, BPH). This condition is very common in older men.  A prostate gland infection (prostatitis).  Medicines to assist with hair growth, such as finasteride. Depending on the PSA results, you may need more tests, such as:  A physical exam to check the size of your prostate gland.  Blood and imaging tests.  A procedure to remove tissue samples from your prostate gland for testing (biopsy). Who should have screening? Screening recommendations vary based on age.  If you are younger than age 44, screening is not recommended.  If you are age 71-54 and you have no risk factors, screening is not recommended.  If you are younger than age 30, ask your health care provider if you need screening if you have one of these risk factors: ? Being of African-American descent. ? Having a family history of prostate cancer.  If you are age 65-69, talk with your health care provider about your need for screening and how often screening should be done.  If you are older than age 43, screening is not recommended. This is because the risks that screening can cause are greater than the benefits  that it may provide (risks outweigh the benefits). If you are at high risk for prostate cancer, your health care provider may recommend that you have screenings more often or start screening at a younger age. You may be at high risk if you:  Are older than age 48.  Are African-American.  Have a father, brother, or uncle who has been diagnosed with prostate cancer. The risk may be higher if your family member's cancer occurred at an early age. What are the benefits of screening? There is a small chance that screening may lower your risk of dying from prostate cancer. The chance is small because prostate cancer is typically a slow-growing cancer, and most men with prostate cancer die from a different cause. What are the risks of screening? The main risk of prostate cancer screening is diagnosing and treating prostate cancer that would never have caused any symptoms or problems (overdiagnosis and overtreatment). PSA screening cannot tell you if your PSA is high due to cancer or a different cause. A prostate biopsy is the only procedure to diagnose prostate cancer. Even the results of a biopsy may not tell you if your cancer needs to be treated. Slow-growing prostate cancer may not need any treatment other than monitoring, so diagnosing and treating it may cause unnecessary stress or other side effects. A prostate biopsy may also cause:  Infection or fever.  A false negative. This is a result that shows that you do not have prostate cancer when you actually do have prostate cancer. Questions  to ask your health care provider  When should I start prostate cancer screening?  What is my risk for prostate cancer?  How often do I need screening?  What type of screening tests do I need?  How do I get my test results?  What do my results mean?  Do I need treatment? Contact a health care provider if:  You have difficulty urinating.  You have pain when you urinate or ejaculate.  You have  blood in your urine or semen.  You have pain in your back or in the area of your prostate.  You have trouble getting or maintaining an erection (erectile dysfunction, ED). Summary  Prostate cancer is a common type of cancer in men. The prostate (prostate gland) is located below the bladder and in front of the rectum. This gland adds fluid to semen during ejaculation.  Prostate cancer screening may identify cancer at an early stage, when the cancer can be treated more easily.  The prostate-specific antigen (PSA) test is the recommended screening test for prostate cancer.  Discuss the risks and benefits of prostate cancer screening with your health care provider. If you are age 75 or older, screening is likely to lead to more risks than benefits (risks outweigh the benefits). This information is not intended to replace advice given to you by your health care provider. Make sure you discuss any questions you have with your health care provider. Document Released: 05/25/2017 Document Revised: 07/27/2017 Document Reviewed: 05/25/2017 Elsevier Patient Education  2020 [employer].   Benign Prostatic Hyperplasia  Benign prostatic hyperplasia (BPH) is an enlarged prostate gland that is caused by the normal aging process and not by cancer. The prostate is a walnut-sized gland that is involved in the production of semen. It is located in front of the rectum and below the bladder. The bladder stores urine and the urethra is the tube that carries the urine out of the body. The prostate may get bigger as a man gets older. An enlarged prostate can press on the urethra. This can make it harder to pass urine. The build-up of urine in the bladder can cause infection. Back pressure and infection may progress to bladder damage and kidney (renal) failure. What are the causes? This condition is part of a normal aging process. However, not all men develop problems from this condition. If the prostate enlarges  away from the urethra, urine flow will not be blocked. If it enlarges toward the urethra and compresses it, there will be problems passing urine. What increases the risk? This condition is more likely to develop in men over the age of 73 years. What are the signs or symptoms? Symptoms of this condition include:  Getting up often during the night to urinate.  Needing to urinate frequently during the day.  Difficulty starting urine flow.  Decrease in size and strength of your urine stream.  Leaking (dribbling) after urinating.  Inability to pass urine. This needs immediate treatment.  Inability to completely empty your bladder.  Pain when you pass urine. This is more common if there is also an infection.  Urinary tract infection (UTI). How is this diagnosed? This condition is diagnosed based on your medical history, a physical exam, and your symptoms. Tests will also be done, such as:  A post-void bladder scan. This measures any amount of urine that may remain in your bladder after you finish urinating.  A digital rectal exam. In a rectal exam, your health care provider checks your  prostate by putting a lubricated, gloved finger into your rectum to feel the back of your prostate gland. This exam detects the size of your gland and any abnormal lumps or growths.  An exam of your urine (urinalysis).  A prostate specific antigen (PSA) screening. This is a blood test used to screen for prostate cancer.  An ultrasound. This test uses sound waves to electronically produce a picture of your prostate gland. Your health care provider may refer you to a specialist in kidney and prostate diseases (urologist). How is this treated? Once symptoms begin, your health care provider will monitor your condition (active surveillance or watchful waiting). Treatment for this condition will depend on the severity of your condition. Treatment may include:  Observation and yearly exams. This may be the  only treatment needed if your condition and symptoms are mild.  Medicines to relieve your symptoms, including: ? Medicines to shrink the prostate. ? Medicines to relax the muscle of the prostate.  Surgery in severe cases. Surgery may include: ? Prostatectomy. In this procedure, the prostate tissue is removed completely through an open incision or with a laparoscope or robotics. ? Transurethral resection of the prostate (TURP). In this procedure, a tool is inserted through the opening at the tip of the penis (urethra). It is used to cut away tissue of the inner core of the prostate. The pieces are removed through the same opening of the penis. This removes the blockage. ? Transurethral incision (TUIP). In this procedure, small cuts are made in the prostate. This lessens the prostate's pressure on the urethra. ? Transurethral microwave thermotherapy (TUMT). This procedure uses microwaves to create heat. The heat destroys and removes a small amount of prostate tissue. ? Transurethral needle ablation (TUNA). This procedure uses radio frequencies to destroy and remove a small amount of prostate tissue. ? Interstitial laser coagulation ([place]). This procedure uses a laser to destroy and remove a small amount of prostate tissue. ? Transurethral electrovaporization (TUVP). This procedure uses electrodes to destroy and remove a small amount of prostate tissue. ? Prostatic urethral lift. This procedure inserts an implant to push the lobes of the prostate away from the urethra. Follow these instructions at home:  Take over-the-counter and prescription medicines only as told by your health care provider.  Monitor your symptoms for any changes. Contact your health care provider with any changes.  Avoid drinking large amounts of liquid before going to bed or out in public.  Avoid or reduce how much caffeine or alcohol you drink.  Give yourself time when you urinate.  Keep all follow-up visits as told by  your health care provider. This is important. Contact a health care provider if:  You have unexplained back pain.  Your symptoms do not get better with treatment.  You develop side effects from the medicine you are taking.  Your urine becomes very dark or has a bad smell.  Your lower abdomen becomes distended and you have trouble passing your urine. Get help right away if:  You have a fever or chills.  You suddenly cannot urinate.  You feel lightheaded, or very dizzy, or you faint.  There are large amounts of blood or clots in the urine.  Your urinary problems become hard to manage.  You develop moderate to severe low back or flank pain. The flank is the side of your body between the ribs and the hip. These symptoms may represent a serious problem that is an emergency. Do not wait to see if  the symptoms will go away. Get medical help right away. Call your local emergency services (911 in the [place] Do not drive yourself to the hospital. Summary  Benign prostatic hyperplasia (BPH) is an enlarged prostate that is caused by the normal aging process and not by cancer.  An enlarged prostate can press on the urethra. This can make it hard to pass urine.  This condition is part of a normal aging process and is more likely to develop in men over the age of 61 years.  Get help right away if you suddenly cannot urinate. This information is not intended to replace advice given to you by your health care provider. Make sure you discuss any questions you have with your health care provider. Document Released: 08/14/2005 Document Revised: 07/09/2018 Document Reviewed: 09/18/2016 Elsevier Patient Education  [place].   Erectile Dysfunction Erectile dysfunction (ED) is the inability to get or keep an erection in order to have sexual intercourse. Erectile dysfunction may include:  Inability to get an erection.  Lack of enough hardness of the erection to allow  penetration.  Loss of the erection before sex is finished. What are the causes? This condition may be caused by:  Certain medicines, such as: ? Pain relievers. ? Antihistamines. ? Antidepressants. ? Blood pressure medicines. ? Water pills (diuretics). ? Ulcer medicines. ? Muscle relaxants. ? Drugs.  Excessive drinking.  Psychological causes, such as: ? Anxiety. ? Depression. ? Sadness. ? Exhaustion. ? Performance fear. ? Stress.  Physical causes, such as: ? Artery problems. This may include diabetes, smoking, liver disease, or atherosclerosis. ? High blood pressure. ? Hormonal problems, such as low testosterone. ? Obesity. ? Nerve problems. This may include back or pelvic injuries, diabetes mellitus, multiple sclerosis, or Parkinson disease. What are the signs or symptoms? Symptoms of this condition include:  Inability to get an erection.  Lack of enough hardness of the erection to allow penetration.  Loss of the erection before sex is finished.  Normal erections at some times, but with frequent unsatisfactory episodes.  Low sexual satisfaction in either partner due to erection problems.  A curved penis occurring with erection. The curve may cause pain or the penis may be too curved to allow for intercourse.  Never having nighttime erections. How is this diagnosed? This condition is often diagnosed by:  Performing a physical exam to find other diseases or specific problems with the penis.  Asking you detailed questions about the problem.  Performing blood tests to check for diabetes mellitus or to measure hormone levels.  Performing other tests to check for underlying health conditions.  Performing an ultrasound exam to check for scarring.  Performing a test to check blood flow to the penis.  Doing a sleep study at home to measure nighttime erections. How is this treated? This condition may be treated by:  Medicine taken by mouth to help you achieve  an erection (oral medicine).  Hormone replacement therapy to replace low testosterone levels.  Medicine that is injected into the penis. Your health care provider may instruct you how to give yourself these injections at home.  Vacuum pump. This is a pump with a ring on it. The pump and ring are placed on the penis and used to create pressure that helps the penis become erect.  Penile implant surgery. In this procedure, you may receive: ? An inflatable implant. This consists of cylinders, a pump, and a reservoir. The cylinders can be inflated with a fluid that helps to  create an erection, and they can be deflated after intercourse. ? A semi-rigid implant. This consists of two silicone rubber rods. The rods provide some rigidity. They are also flexible, so the penis can both curve downward in its normal position and become straight for sexual intercourse.  Blood vessel surgery, to improve blood flow to the penis. During this procedure, a blood vessel from a different part of the body is placed into the penis to allow blood to flow around (bypass) damaged or blocked blood vessels.  Lifestyle changes, such as exercising more, losing weight, and quitting smoking. Follow these instructions at home: Medicines   Take over-the-counter and prescription medicines only as told by your health care provider. Do not increase the dosage without first discussing it with your health care provider.  If you are using self-injections, perform injections as directed by your health care provider. Make sure to avoid any veins that are on the surface of the penis. After giving an injection, apply pressure to the injection site for 5 minutes. General instructions  Exercise regularly, as directed by your health care provider. Work with your health care provider to lose weight, if needed.  Do not use any products that contain nicotine or tobacco, such as cigarettes and e-cigarettes. If you need help quitting, ask  your health care provider.  Before using a vacuum pump, read the instructions that come with the pump and discuss any questions with your health care provider.  Keep all follow-up visits as told by your health care provider. This is important. Contact a health care provider if:  You feel nauseous.  You vomit. Get help right away if:  You are taking oral or injectable medicines and you have an erection that lasts longer than 4 hours. If your health care provider is unavailable, go to the nearest emergency room for evaluation. An erection that lasts much longer than 4 hours can result in permanent damage to your penis.  You have severe pain in your groin or abdomen.  You develop redness or severe swelling of your penis.  You have redness spreading up into your groin or lower abdomen.  You are unable to urinate.  You experience chest pain or a rapid heart beat (palpitations) after taking oral medicines. Summary  Erectile dysfunction (ED) is the inability to get or keep an erection during sexual intercourse. This problem can usually be treated successfully.  This condition is diagnosed based on a physical exam, your symptoms, and tests to determine the cause. Treatment varies depending on the cause, and may include medicines, hormone therapy, surgery, or vacuum pump.  You may need follow-up visits to make sure that you are using your medicines or devices correctly.  Get help right away if you are taking or injecting medicines and you have an erection that lasts longer than 4 hours. This information is not intended to replace advice given to you by your health care provider. Make sure you discuss any questions you have with your health care provider. Document Released: 08/11/2000 Document Revised: 07/27/2017 Document Reviewed: 08/30/2016 Elsevier Patient Education  2020 [employer].

## 2019-03-19 ENCOUNTER — Other Ambulatory Visit: Payer: Self-pay | Admitting: Cardiovascular Disease

## 2019-03-19 LAB — PSA, TOTAL AND FREE
PSA, Free Pct: 27.5 %
PSA, Free: 0.77 ng/mL
Prostate Specific Ag, Serum: 2.8 ng/mL (ref 0.0–4.0)

## 2019-03-19 MED FILL — DILTIAZEM 24HR CD 120 MG CA: 120 | 30 days supply | Qty: 30 | Fill #0

## 2019-03-20 ENCOUNTER — Telehealth: Payer: Self-pay | Admitting: *Deleted

## 2019-03-20 DIAGNOSIS — R972 Elevated prostate specific antigen [PSA]: Secondary | ICD-10-CM

## 2019-03-20 NOTE — Telephone Encounter (Addendum)
Informed patient-verbalized understanding. Offered to schedule appointment-sated he will call back to schedule he didn't have his schedule with him.    ----- Message from Billey Co, MD sent at 03/20/2019  7:39 AM EDT ----- PSA dropped back down to normal at 2.8, good news. No need for prostate biopsy. Please schedule follow up in one year with PSA w free/total ratio.

## 2019-03-27 DIAGNOSIS — L821 Other seborrheic keratosis: Secondary | ICD-10-CM | POA: Diagnosis not present

## 2019-04-24 MED FILL — CARTIA XT 120 MG CP24: 120 | 30 days supply | Qty: 30 | Fill #1

## 2019-05-20 MED FILL — CARTIA XT 120 MG CP24: 120 | 30 days supply | Qty: 30 | Fill #2

## 2019-05-29 ENCOUNTER — Encounter: Payer: Self-pay | Admitting: Internal Medicine

## 2019-05-29 ENCOUNTER — Other Ambulatory Visit: Payer: Self-pay

## 2019-05-29 ENCOUNTER — Ambulatory Visit: Payer: 59 | Admitting: Internal Medicine

## 2019-05-29 VITALS — BP 116/78 | HR 63 | Temp 98.2°F | Ht 69.0 in | Wt 200.0 lb

## 2019-05-29 DIAGNOSIS — Z23 Encounter for immunization: Secondary | ICD-10-CM

## 2019-05-29 DIAGNOSIS — R1084 Generalized abdominal pain: Secondary | ICD-10-CM

## 2019-05-29 NOTE — Progress Notes (Signed)
Date:  05/29/2019   Name:  Ronald Fry   DOB:  Dec 25, 1960   MRN:  [id number]   Chief Complaint: Abdominal Pain (Started early this week. On and off. Pain and vomiting/ nausea.  Flu shot.)  Abdominal Pain This is a new problem. The current episode started in the past 7 days. The problem occurs intermittently. The problem has been unchanged. The pain is located in the generalized abdominal region. The pain is mild. The quality of the pain is colicky and cramping. The abdominal pain does not radiate. Associated symptoms include nausea and vomiting. Pertinent negatives include no belching, diarrhea, fever, headaches or hematuria.    Review of Systems  Constitutional: Negative for chills, fatigue and fever.  HENT: Negative for trouble swallowing.   Respiratory: Negative for chest tightness, shortness of breath and wheezing.   Cardiovascular: Negative for chest pain, palpitations and leg swelling.  Gastrointestinal: Positive for abdominal pain, nausea and vomiting. Negative for blood in stool and diarrhea.  Genitourinary: Negative for hematuria.  Musculoskeletal: Negative for back pain.  Neurological: Positive for light-headedness. Negative for dizziness and headaches.    Patient Active Problem List   Diagnosis Date Noted  . Atrial fibrillation ([place]) 11/17/2018  . Major depression in partial remission ([place]) 09/17/2018  . Chronic pain of right ankle 09/17/2018  . Mild hyperlipidemia 10/07/2017  . Environmental and seasonal allergies 08/07/2016  . Hypotestosteronism 11/02/2015  . Intermittent palpitations 07/27/2015  . Gastroesophageal reflux disease 07/27/2015  . Asthma, mild intermittent 07/27/2015  . Vitamin D deficiency 07/27/2015  . Obesity, Class I, BMI 30-34.9 07/26/2015    No Known Allergies  Past Surgical History:  Procedure Laterality Date  . COLONOSCOPY WITH PROPOFOL N/A 11/08/2018   Procedure: COLONOSCOPY WITH BIOPSY;  Surgeon: Lucilla Lame, MD;  Location: [facility];  Service: Endoscopy;  Laterality: N/A;  . NO PAST SURGERIES    . POLYPECTOMY N/A 11/08/2018   Procedure: POLYPECTOMY;  Surgeon: Lucilla Lame, MD;  Location: [facility];  Service: Endoscopy;  Laterality: N/A;  . TREATMENT FISTULA ANAL      Social History   Tobacco Use  . Smoking status: Never Smoker  . Smokeless tobacco: Never Used  Substance Use Topics  . Alcohol use: Yes    Alcohol/week: 1.0 standard drinks    Types: 1 Cans of beer per week  . Drug use: No     Medication list has been reviewed and updated.  Current Meds  Medication Sig  . albuterol (PROAIR HFA) 108 (90 Base) MCG/ACT inhaler Inhale 2 puffs into the lungs every 6 (six) hours as needed for wheezing or shortness of breath.  Marland Kitchen albuterol (PROVENTIL) (2.5 MG/3ML) 0.083% nebulizer solution Take 3 mLs (2.5 mg total) by nebulization every 6 (six) hours as needed for wheezing or shortness of breath.  . ALPRAZolam (XANAX) 0.25 MG tablet Take 1 tablet (0.25 mg total) by mouth daily as needed for anxiety.  Marland Kitchen apixaban (ELIQUIS) 5 MG TABS tablet Take 1 tablet (5 mg total) by mouth 2 (two) times daily.  . cetirizine (ZYRTEC) 10 MG tablet Take 10 mg by mouth daily.  Marland Kitchen diltiazem (CARDIZEM CD) 120 MG 24 hr capsule TAKE 1 CAPSULE (120 MG TOTAL) BY MOUTH DAILY.  . flecainide (TAMBOCOR) 50 MG tablet Take 1 tablet (50 mg total) by mouth 2 (two) times daily.  . fluticasone (FLONASE) 50 MCG/ACT nasal spray Place into both nostrils daily.  . sildenafil (VIAGRA) 100 MG tablet Take 0.5 tablets (50 mg total) by  mouth daily as needed for up to 30 days for erectile dysfunction.  . tadalafil (CIALIS) 20 MG tablet Take 1 tablet (20 mg total) by mouth daily as needed for erectile dysfunction.    PHQ 2/9 Scores 05/29/2019 11/18/2018 10/21/2018 09/17/2018  PHQ - 2 Score 0 0 0 6  PHQ- 9 Score 0 0 0 21    BP Readings from Last 3 Encounters:  05/29/19 116/78  03/18/19 119/80  11/18/18 112/64    Physical Exam Vitals signs  and nursing note reviewed.  Constitutional:      General: He is not in acute distress.    Appearance: He is well-developed.  HENT:     Head: Normocephalic and atraumatic.  Cardiovascular:     Rate and Rhythm: Normal rate and regular rhythm.  Pulmonary:     Effort: Pulmonary effort is normal. No respiratory distress.     Breath sounds: Normal breath sounds.  Abdominal:     General: Abdomen is flat. Bowel sounds are decreased.     Palpations: Abdomen is soft.     Tenderness: There is abdominal tenderness in the right upper quadrant and epigastric area. There is no right CVA tenderness, left CVA tenderness or guarding. Negative signs include Murphy's sign.  Musculoskeletal: Normal range of motion.  Skin:    General: Skin is warm and dry.     Capillary Refill: Capillary refill takes less than 2 seconds.     Findings: No rash.  Neurological:     General: No focal deficit present.     Mental Status: He is alert and oriented to person, place, and time.  Psychiatric:        Behavior: Behavior normal.        Thought Content: Thought content normal.     Wt Readings from Last 3 Encounters:  05/29/19 200 lb (90.7 kg)  03/18/19 190 lb (86.2 kg)  01/08/19 192 lb (87.1 kg)    BP 116/78   Pulse 63   Temp 98.2 F (36.8 C) (Oral)   Ht 5\' 9"  (1.753 m)   Wt 200 lb (90.7 kg)   SpO2 97%   BMI 29.53 kg/m   Assessment and Plan: 1. Generalized abdominal pain Suspect gall bladder or pancreas Continue bland diet; increase fluids Go to ED if sx are severe - US Abdomen Complete; Future - CBC with Differential/Platelet - Comprehensive metabolic panel - Lipase   Partially dictated using Dragon software. Any errors are unintentional.  Halina Maidens, MD [facility]  05/29/2019

## 2019-05-30 LAB — COMPREHENSIVE METABOLIC PANEL
ALT: 12 IU/L (ref 0–44)
AST: 12 IU/L (ref 0–40)
Albumin/Globulin Ratio: 2.6 — ABNORMAL HIGH (ref 1.2–2.2)
Albumin: 4.5 g/dL (ref 3.8–4.9)
Alkaline Phosphatase: 64 IU/L (ref 39–117)
BUN/Creatinine Ratio: 15 (ref 9–20)
BUN: 15 mg/dL (ref 6–24)
Bilirubin Total: 0.3 mg/dL (ref 0.0–1.2)
CO2: 25 mmol/L (ref 20–29)
Calcium: 9.7 mg/dL (ref 8.7–10.2)
Chloride: 102 mmol/L (ref 96–106)
Creatinine, Ser: 0.98 mg/dL (ref 0.76–1.27)
GFR calc Af Amer: 97 mL/min/{1.73_m2} (ref >59)
GFR calc non Af Amer: 84 mL/min/{1.73_m2} (ref >59)
Globulin, Total: 1.7 g/dL (ref 1.5–4.5)
Glucose: 83 mg/dL (ref 65–99)
Potassium: 4.9 mmol/L (ref 3.5–5.2)
Sodium: 141 mmol/L (ref 134–144)
Total Protein: 6.2 g/dL (ref 6.0–8.5)

## 2019-05-30 LAB — CBC WITH DIFFERENTIAL/PLATELET
Basophils Absolute: 0.1 10*3/uL (ref 0.0–0.2)
Basos: 1 %
EOS (ABSOLUTE): 0.1 10*3/uL (ref 0.0–0.4)
Eos: 1 %
Hematocrit: 43.9 % (ref 37.5–51.0)
Hemoglobin: 15.3 g/dL (ref 13.0–17.7)
Immature Grans (Abs): 0 10*3/uL (ref 0.0–0.1)
Immature Granulocytes: 0 %
Lymphocytes Absolute: 2.1 10*3/uL (ref 0.7–3.1)
Lymphs: 31 %
MCH: 31.2 pg (ref 26.6–33.0)
MCHC: 34.9 g/dL (ref 31.5–35.7)
MCV: 90 fL (ref 79–97)
Monocytes Absolute: 0.5 10*3/uL (ref 0.1–0.9)
Monocytes: 8 %
Neutrophils Absolute: 4 10*3/uL (ref 1.4–7.0)
Neutrophils: 59 %
Platelets: 219 10*3/uL (ref 150–450)
RBC: 4.9 x10E6/uL (ref 4.14–5.80)
RDW: 12.7 % (ref 11.6–15.4)
WBC: 6.7 10*3/uL (ref 3.4–10.8)

## 2019-05-30 LAB — LIPASE: Lipase: 28 U/L (ref 13–78)

## 2019-06-05 ENCOUNTER — Other Ambulatory Visit: Payer: Self-pay

## 2019-06-05 ENCOUNTER — Ambulatory Visit
Admission: RE | Admit: 2019-06-05 | Discharge: 2019-06-05 | Disposition: A | Discharge status: Home / Self Care | Payer: 59 | Source: Ambulatory Visit | Attending: Internal Medicine | Admitting: Internal Medicine

## 2019-06-05 DIAGNOSIS — R1084 Generalized abdominal pain: Secondary | ICD-10-CM | POA: Diagnosis not present

## 2019-06-05 DIAGNOSIS — K8020 Calculus of gallbladder without cholecystitis without obstruction: Secondary | ICD-10-CM | POA: Diagnosis not present

## 2019-06-06 ENCOUNTER — Other Ambulatory Visit: Payer: Self-pay | Admitting: Internal Medicine

## 2019-06-06 DIAGNOSIS — K8020 Calculus of gallbladder without cholecystitis without obstruction: Secondary | ICD-10-CM

## 2019-06-06 NOTE — Progress Notes (Signed)
Patient informed. Agrees to Gen Surgery referral for consultation.

## 2019-06-19 ENCOUNTER — Other Ambulatory Visit: Payer: Self-pay | Admitting: *Deleted

## 2019-06-19 ENCOUNTER — Ambulatory Visit: Payer: 59 | Admitting: General Surgery

## 2019-06-19 MED ORDER — DILTIAZEM HCL ER COATED BEADS 120 MG PO CP24: 120.0000 mg | ORAL_CAPSULE | Freq: Every day | ORAL | 2 refills | Status: DC

## 2019-07-18 ENCOUNTER — Other Ambulatory Visit: Payer: Self-pay | Admitting: Internal Medicine

## 2019-07-18 ENCOUNTER — Other Ambulatory Visit: Payer: Self-pay

## 2019-07-18 ENCOUNTER — Encounter: Payer: Self-pay | Admitting: Internal Medicine

## 2019-07-18 DIAGNOSIS — J4520 Mild intermittent asthma, uncomplicated: Secondary | ICD-10-CM

## 2019-07-18 MED ORDER — ALBUTEROL SULFATE HFA 108 (90 BASE) MCG/ACT IN AERS: 2.0000 | INHALATION_SPRAY | Freq: Four times a day (QID) | RESPIRATORY_TRACT | 1 refills | Status: DC | PRN

## 2019-07-21 MED ORDER — DILTIAZEM HCL ER COATED BEADS 120 MG PO CP24: 120.0000 mg | ORAL_CAPSULE | Freq: Every day | ORAL | 1 refills | Status: DC

## 2019-07-21 MED ORDER — FLECAINIDE ACETATE 50 MG PO TABS: 50.0000 mg | ORAL_TABLET | Freq: Two times a day (BID) | ORAL | 1 refills | Status: DC

## 2019-07-21 MED ORDER — APIXABAN 5 MG PO TABS: 5.0000 mg | ORAL_TABLET | Freq: Two times a day (BID) | ORAL | 1 refills | Status: DC

## 2019-07-22 MED ORDER — APIXABAN 5 MG PO TABS: 5.0000 mg | ORAL_TABLET | Freq: Two times a day (BID) | ORAL | 1 refills | Status: DC

## 2019-07-22 NOTE — Addendum Note (Signed)
Addended by: Vanessa Ralphs on: 07/22/2019 02:04 PM   Modules accepted: Orders

## 2019-07-23 ENCOUNTER — Telehealth: Payer: Self-pay

## 2019-07-23 NOTE — Telephone Encounter (Signed)
Eliquis Prior Authorization form completed through [email or web address] ([employer]). KEY: [id number]  PA Case ID [id number] Approved Start Date: 06/23/2019 through 07/22/2020

## 2019-08-29 HISTORY — PX: CARDIAC ELECTROPHYSIOLOGY STUDY AND ABLATION: SHX1294

## 2019-09-23 ENCOUNTER — Encounter: Payer: Self-pay | Admitting: *Deleted

## 2019-10-17 IMAGING — US US ABDOMEN COMPLETE
1 series · 14 of 25 positions shown · non-contrast
Comparison: None.

CLINICAL DATA: Epigastric pain

EXAM:
ABDOMEN ULTRASOUND COMPLETE

[Series 1: us abdomen complete · 0.25mm/px · 14 of 115 slices shown]
[im 1/115]
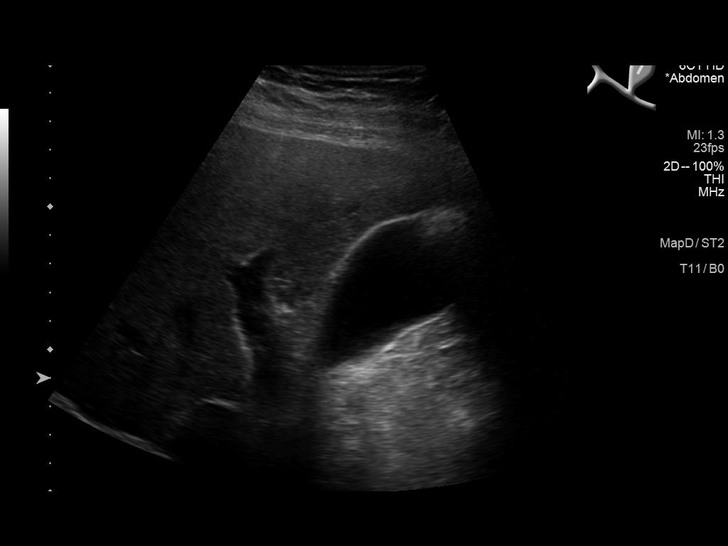
[im 10/115]
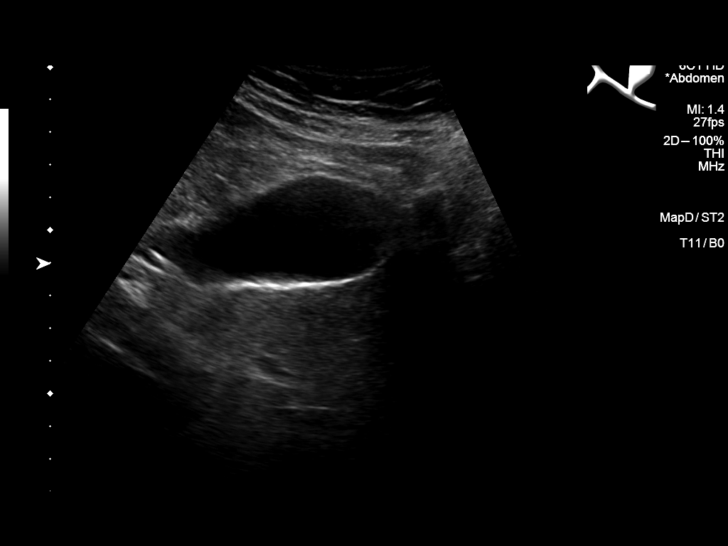
[im 20/115]
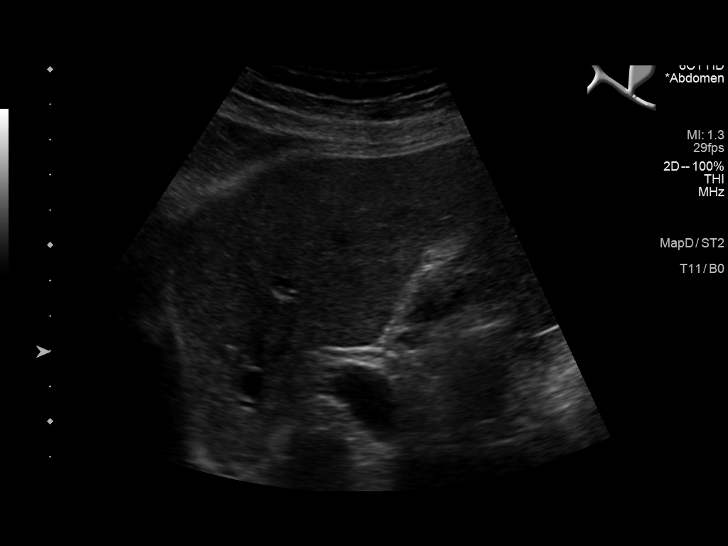
[im 29/115]
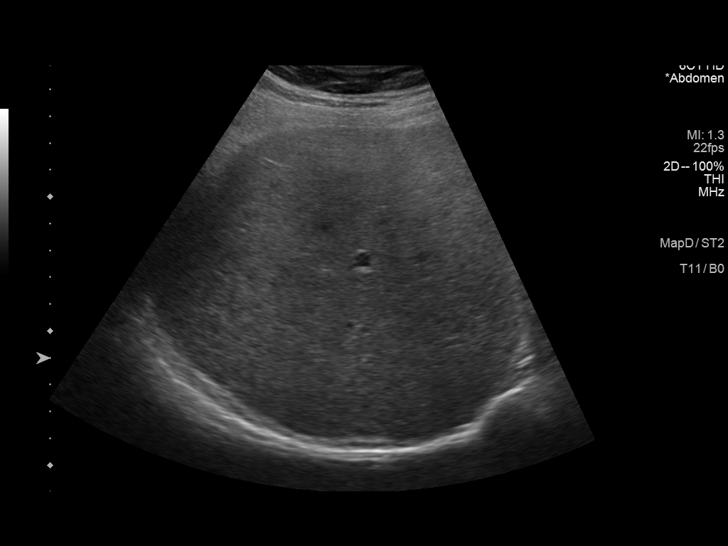
[im 39/115]
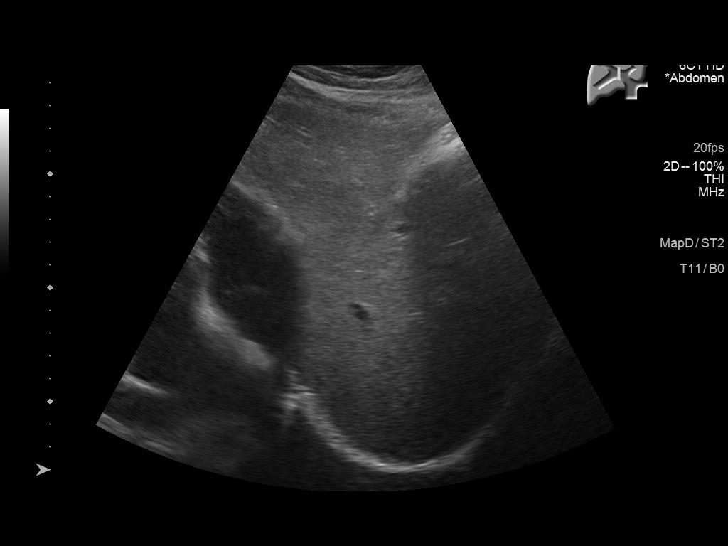
[im 43/115]
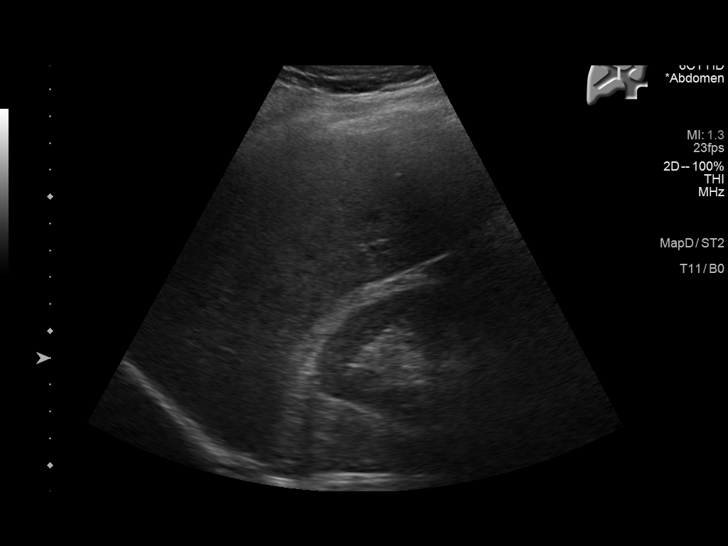
[im 53/115]
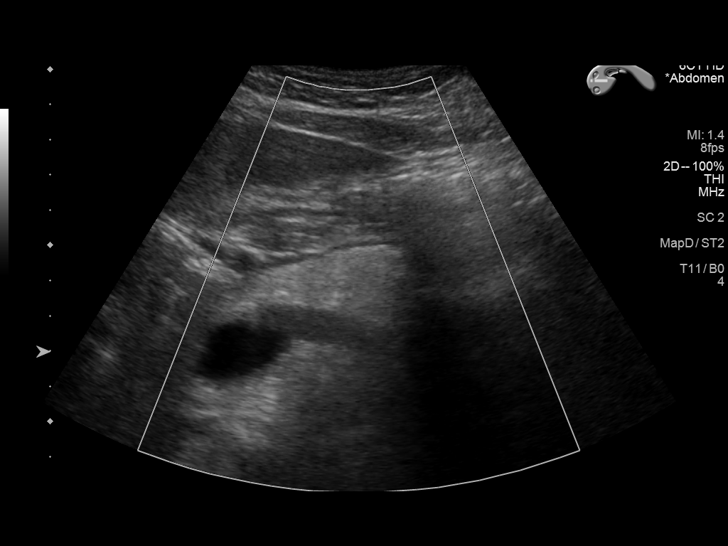
[im 62/115]
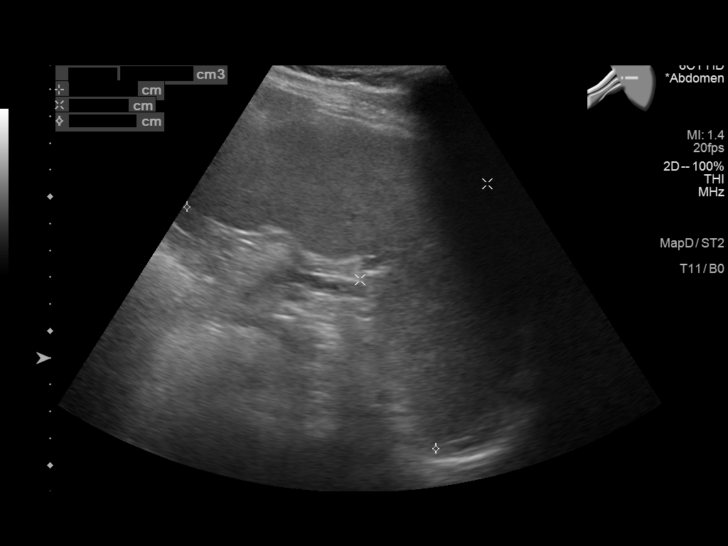
[im 72/115]
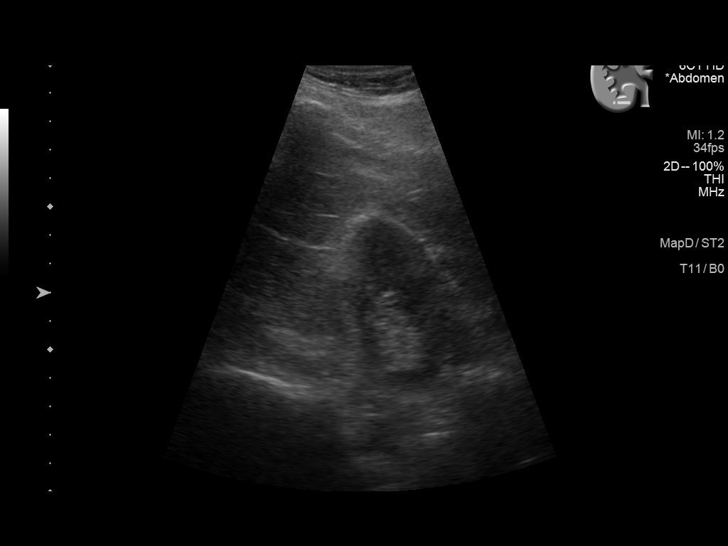
[im 77/115]
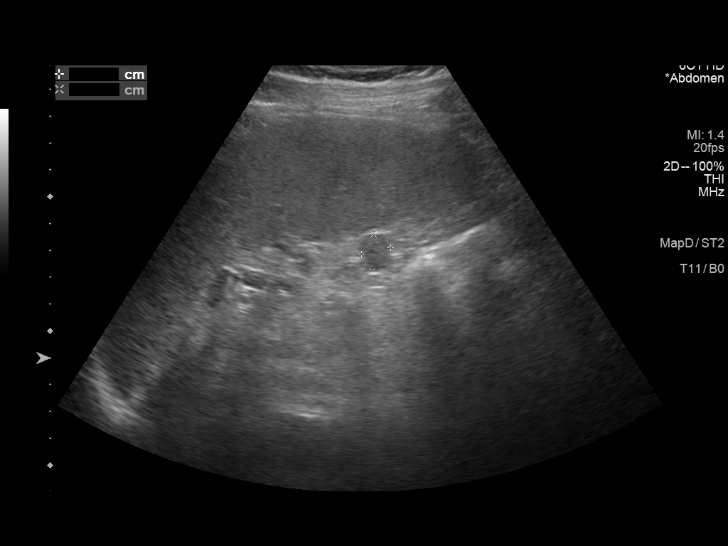
[im 86/115]
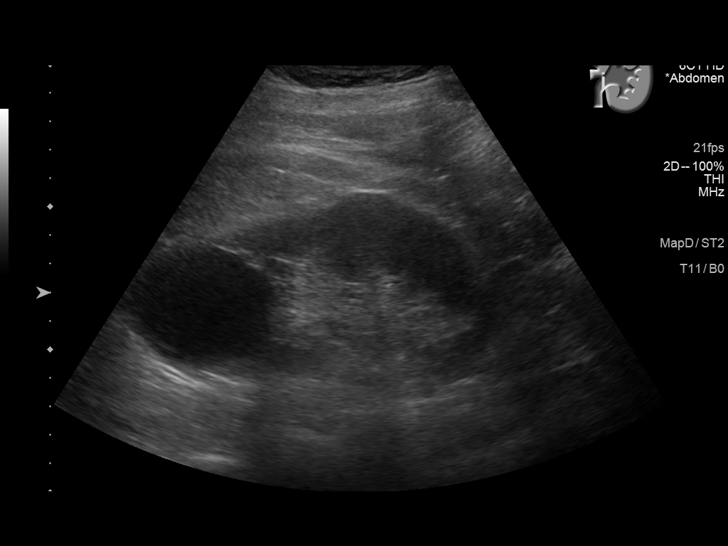
[im 96/115]
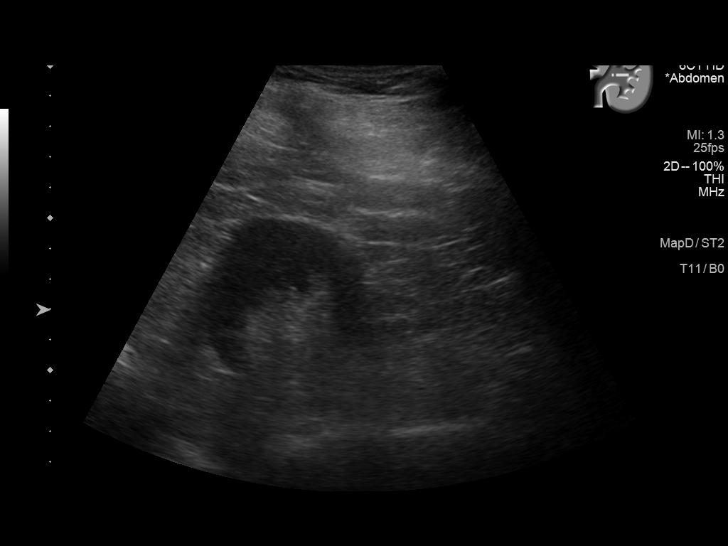
[im 105/115]
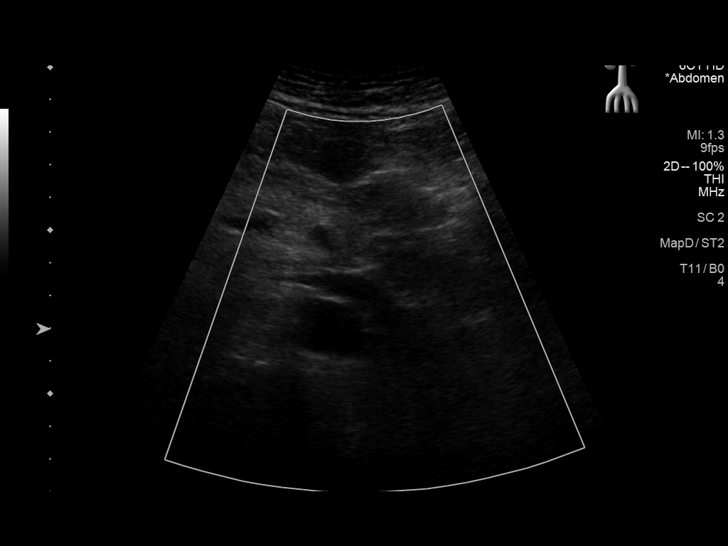
[im 115/115]
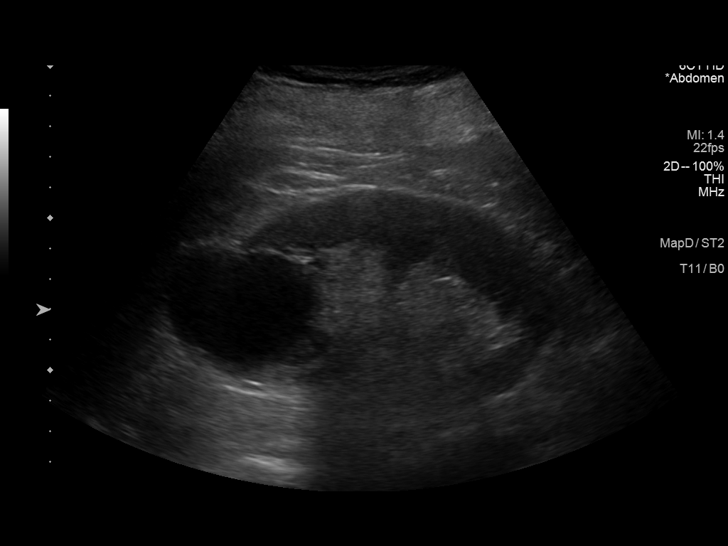

[14 of 25 positions shown; findings below may reference images not displayed]

FINDINGS: Gallbladder: Cholelithiasis without pericholecystic fluid or
gallbladder wall thickening. Negative sonographic Murphy sign.

Common bile duct: Diameter: 2.4 mm

Liver: No focal lesion identified. Within normal limits in
parenchymal echogenicity. Portal vein is patent on color Doppler
imaging with normal direction of blood flow towards the liver.

IVC: No abnormality visualized.

Pancreas: Visualized portion unremarkable.

Spleen: Size and appearance within normal limits.

Right Kidney: Length: 10.5 cm. Echogenicity within normal limits. No
mass or hydronephrosis visualized.

Left Kidney: Length: 11.7 cm. Echogenicity within normal limits. No
solid mass or hydronephrosis visualized. 4.8 x 3.6 x 4.1 cm anechoic
left renal mass consistent with a cyst.

Abdominal aorta: No aneurysm visualized.

Other findings: None.
IMPRESSION: 1. Cholelithiasis without sonographic evidence of acute
cholecystitis.

## 2019-10-24 ENCOUNTER — Encounter: Payer: 59 | Admitting: Internal Medicine

## 2019-11-23 ENCOUNTER — Ambulatory Visit: Payer: 59 | Attending: Internal Medicine

## 2019-11-23 DIAGNOSIS — Z23 Encounter for immunization: Secondary | ICD-10-CM

## 2019-11-23 NOTE — Progress Notes (Signed)
   Covid-19 Vaccination Clinic  Name:  Ronald Fry    MRN: [id number] DOB: 1961/07/20  11/23/2019  [name] was observed post Covid-19 immunization for 15 minutes without incident. He was provided with Vaccine Information Sheet and instruction to access the V-Safe system.   [name] was instructed to call 911 with any severe reactions post vaccine: Marland Kitchen Difficulty breathing  . Swelling of face and throat  . A fast heartbeat  . A bad rash all over body  . Dizziness and weakness   Immunizations Administered    Name Date Dose VIS Date Route   Pfizer COVID-19 Vaccine 11/23/2019 11:33 AM 0.3 mL 08/08/2019 Intramuscular   Manufacturer: [place]   Lot: [id number]   [place]: [id number]

## 2019-12-17 ENCOUNTER — Ambulatory Visit: Payer: 59

## 2019-12-17 ENCOUNTER — Ambulatory Visit: Payer: 59 | Attending: Internal Medicine

## 2019-12-17 DIAGNOSIS — Z23 Encounter for immunization: Secondary | ICD-10-CM

## 2019-12-17 NOTE — Progress Notes (Signed)
   Covid-19 Vaccination Clinic  Name:  Ronald Fry    MRN: [id number] DOB: 1960-11-11  12/17/2019  [name] was observed post Covid-19 immunization for 15 minutes without incident. He was provided with Vaccine Information Sheet and instruction to access the V-Safe system.   [name] was instructed to call 911 with any severe reactions post vaccine: Marland Kitchen Difficulty breathing  . Swelling of face and throat  . A fast heartbeat  . A bad rash all over body  . Dizziness and weakness   Immunizations Administered    Name Date Dose VIS Date Route   Pfizer COVID-19 Vaccine 12/17/2019  9:07 AM 0.3 mL 10/22/2018 Intramuscular   Manufacturer: [place]   Lot: [id number]   [place]: [id number]

## 2019-12-30 ENCOUNTER — Telehealth: Payer: Self-pay | Admitting: Cardiovascular Disease

## 2019-12-30 NOTE — Telephone Encounter (Signed)
Patient is under new insurance and has had to change his cardiologist to a [facility] provider Recall deleted

## 2020-01-07 ENCOUNTER — Other Ambulatory Visit: Payer: Self-pay

## 2020-01-07 ENCOUNTER — Encounter: Payer: Self-pay | Admitting: Internal Medicine

## 2020-01-07 ENCOUNTER — Ambulatory Visit (INDEPENDENT_AMBULATORY_CARE_PROVIDER_SITE_OTHER): Payer: 59 | Admitting: Internal Medicine

## 2020-01-07 VITALS — BP 134/78 | HR 70 | Temp 97.5°F | Ht 69.0 in | Wt 218.0 lb

## 2020-01-07 DIAGNOSIS — J0120 Acute ethmoidal sinusitis, unspecified: Secondary | ICD-10-CM | POA: Diagnosis not present

## 2020-01-07 MED ORDER — AZITHROMYCIN 250 MG PO TABS: ORAL_TABLET | ORAL | 0 refills | Status: AC

## 2020-01-07 MED ORDER — PREDNISONE 10 MG PO TABS: 10.0000 mg | ORAL_TABLET | ORAL | 0 refills | Status: AC

## 2020-01-07 NOTE — Progress Notes (Signed)
Date:  01/07/2020   Name:  Ronald Fry   DOB:  11/29/1960   MRN:  [id number]   Chief Complaint: Sinusitis (Pt has had both vaccines. Having headaches and dizziness for the past week. Taking sudafed, flonase and zyrtec with tylenol. Thinks he may have sinus infection. Some nasal drainage with cough only when outside. )  Sinus Problem This is a new problem. The current episode started in the past 7 days. The problem is unchanged. There has been no fever. The pain is mild. Associated symptoms include congestion, headaches and sinus pressure. Pertinent negatives include no chills, coughing, ear pain (but vertigo), shortness of breath, sore throat or swollen glands. Past treatments include acetaminophen, oral decongestants and spray decongestants. The treatment provided mild relief.    Lab Results  Component Value Date   CREATININE 0.98 05/29/2019   BUN 15 05/29/2019   NA 141 05/29/2019   K 4.9 05/29/2019   CL 102 05/29/2019   CO2 25 05/29/2019   Lab Results  Component Value Date   CHOL 132 10/21/2018   HDL 44 10/21/2018   LDLCALC 68 10/21/2018   TRIG 98 10/21/2018   CHOLHDL 3.0 10/21/2018   Lab Results  Component Value Date   TSH 1.758 11/08/2018   Lab Results  Component Value Date   HGBA1C 5.1 10/21/2018   Lab Results  Component Value Date   WBC 6.7 05/29/2019   HGB 15.3 05/29/2019   HCT 43.9 05/29/2019   MCV 90 05/29/2019   PLT 219 05/29/2019   Lab Results  Component Value Date   ALT 12 05/29/2019   AST 12 05/29/2019   ALKPHOS 64 05/29/2019   BILITOT 0.3 05/29/2019     Review of Systems  Constitutional: Negative for chills, fatigue and fever.  HENT: Positive for congestion, postnasal drip and sinus pressure. Negative for ear pain (but vertigo), sore throat and trouble swallowing.   Respiratory: Negative for cough and shortness of breath.   Cardiovascular: Positive for palpitations. Negative for chest pain and leg swelling.  Neurological: Positive for  dizziness and headaches. Negative for tremors, syncope and weakness.    Patient Active Problem List   Diagnosis Date Noted  . Atrial fibrillation ([place]) 11/17/2018  . Major depression in partial remission ([place]) 09/17/2018  . Chronic pain of right ankle 09/17/2018  . Mild hyperlipidemia 10/07/2017  . Environmental and seasonal allergies 08/07/2016  . Hypotestosteronism 11/02/2015  . Intermittent palpitations 07/27/2015  . Gastroesophageal reflux disease 07/27/2015  . Asthma, mild intermittent 07/27/2015  . Vitamin D deficiency 07/27/2015  . Obesity, Class I, BMI 30-34.9 07/26/2015    No Known Allergies  Past Surgical History:  Procedure Laterality Date  . COLONOSCOPY WITH PROPOFOL N/A 11/08/2018   Procedure: COLONOSCOPY WITH BIOPSY;  Surgeon: Lucilla Lame, MD;  Location: [facility];  Service: Endoscopy;  Laterality: N/A;  . POLYPECTOMY N/A 11/08/2018   Procedure: POLYPECTOMY;  Surgeon: Lucilla Lame, MD;  Location: [facility];  Service: Endoscopy;  Laterality: N/A;  . TREATMENT FISTULA ANAL      Social History   Tobacco Use  . Smoking status: Never Smoker  . Smokeless tobacco: Never Used  Substance Use Topics  . Alcohol use: Yes    Alcohol/week: 1.0 standard drinks    Types: 1 Cans of beer per week  . Drug use: No     Medication list has been reviewed and updated.  Current Meds  Medication Sig  . albuterol (PROAIR HFA) 108 (90 Base) MCG/ACT inhaler Inhale  2 puffs into the lungs every 6 (six) hours as needed for wheezing or shortness of breath.  Marland Kitchen albuterol (PROVENTIL) (2.5 MG/3ML) 0.083% nebulizer solution Take 3 mLs (2.5 mg total) by nebulization every 6 (six) hours as needed for wheezing or shortness of breath.  Marland Kitchen apixaban (ELIQUIS) 5 MG TABS tablet Take 1 tablet (5 mg total) by mouth 2 (two) times daily.  . cetirizine (ZYRTEC) 10 MG tablet Take 10 mg by mouth daily.  Marland Kitchen diltiazem (CARDIZEM CD) 120 MG 24 hr capsule Take 1 capsule (120 mg total) by mouth  daily.  . fluticasone (FLONASE) 50 MCG/ACT nasal spray Place into both nostrils daily.  . tadalafil (CIALIS) 20 MG tablet Take 1 tablet (20 mg total) by mouth daily as needed for erectile dysfunction.    PHQ 2/9 Scores 01/07/2020 05/29/2019 11/18/2018 10/21/2018  PHQ - 2 Score 0 0 0 0  PHQ- 9 Score 0 0 0 0    BP Readings from Last 3 Encounters:  01/07/20 134/78  05/29/19 116/78  03/18/19 119/80    Physical Exam Vitals and nursing note reviewed.  Constitutional:      General: He is not in acute distress.    Appearance: He is well-developed.  HENT:     Head: Normocephalic and atraumatic.     Right Ear: Tympanic membrane is retracted.     Left Ear: Tympanic membrane is retracted.     Nose:     Right Sinus: Maxillary sinus tenderness present. No frontal sinus tenderness.     Left Sinus: Maxillary sinus tenderness present. No frontal sinus tenderness.     Mouth/Throat:     Pharynx: Oropharynx is clear. No posterior oropharyngeal erythema.  Cardiovascular:     Rate and Rhythm: Normal rate and regular rhythm. Frequent extrasystoles are present. Pulmonary:     Effort: Pulmonary effort is normal. No respiratory distress.     Breath sounds: Normal breath sounds.  Musculoskeletal:        General: Normal range of motion.  Skin:    General: Skin is warm and dry.     Findings: No rash.  Neurological:     Mental Status: He is alert and oriented to person, place, and time.  Psychiatric:        Behavior: Behavior normal.        Thought Content: Thought content normal.     Wt Readings from Last 3 Encounters:  01/07/20 218 lb (98.9 kg)  05/29/19 200 lb (90.7 kg)  03/18/19 190 lb (86.2 kg)    BP 134/78   Pulse 70   Temp (!) 97.5 F (36.4 C) (Temporal)   Ht 5\' 9"  (1.753 m)   Wt 218 lb (98.9 kg)   SpO2 97%   BMI 32.19 kg/m   Assessment and Plan: 1. Acute non-recurrent ethmoidal sinusitis Continue flonase, tylenol and sudafed Follow up if no improvement - azithromycin  (ZITHROMAX Z-PAK) 250 MG tablet; UAD  Dispense: 6 each; Refill: 0 - predniSONE (DELTASONE) 10 MG tablet; Take 1 tablet (10 mg total) by mouth as directed for 6 days. Take 6,5,4,3,2,1 then stop  Dispense: 21 tablet; Refill: 0   Partially dictated using Editor, commissioning. Any errors are unintentional.  Halina Maidens, MD [facility]  01/07/2020

## 2020-01-27 ENCOUNTER — Other Ambulatory Visit: Payer: Self-pay

## 2020-01-27 ENCOUNTER — Encounter: Payer: Self-pay | Admitting: Internal Medicine

## 2020-01-27 MED ORDER — TADALAFIL 20 MG PO TABS: 20.0000 mg | ORAL_TABLET | Freq: Every day | ORAL | 3 refills | Status: DC | PRN

## 2020-01-29 ENCOUNTER — Other Ambulatory Visit: Payer: Self-pay | Admitting: Internal Medicine

## 2020-01-29 MED ORDER — TADALAFIL 20 MG PO TABS: 20.0000 mg | ORAL_TABLET | Freq: Every day | ORAL | 3 refills | Status: DC | PRN

## 2020-02-02 ENCOUNTER — Other Ambulatory Visit: Payer: Self-pay | Admitting: Internal Medicine

## 2020-02-02 DIAGNOSIS — N529 Male erectile dysfunction, unspecified: Secondary | ICD-10-CM

## 2020-02-02 MED ORDER — TADALAFIL 20 MG PO TABS: 20.0000 mg | ORAL_TABLET | Freq: Every day | ORAL | 0 refills | Status: DC | PRN

## 2020-02-09 ENCOUNTER — Encounter: Payer: Self-pay | Admitting: Internal Medicine

## 2020-02-09 ENCOUNTER — Ambulatory Visit: Payer: 59 | Admitting: Internal Medicine

## 2020-02-09 ENCOUNTER — Other Ambulatory Visit: Payer: Self-pay

## 2020-02-09 VITALS — BP 128/70 | HR 74 | Temp 98.2°F | Ht 69.0 in | Wt 219.0 lb

## 2020-02-09 DIAGNOSIS — B882 Other arthropod infestations: Secondary | ICD-10-CM | POA: Diagnosis not present

## 2020-02-09 MED ORDER — DOXYCYCLINE HYCLATE 100 MG PO TABS: 100.0000 mg | ORAL_TABLET | Freq: Two times a day (BID) | ORAL | 0 refills | Status: AC

## 2020-02-09 NOTE — Progress Notes (Signed)
Date:  02/09/2020   Name:  Ronald Fry   DOB:  December 09, 1960   MRN:  [id number]   Chief Complaint: Tick Removal (Stiff neck, and headaches. Wants labs checked. Was last bit by a tick 10 days ago. Removed with alcohol and a plastic tool. Having more fatigue than normal. )  Tick bites - first bite three weeks ago and another 10 days ago.  Never had a target lesion or rash.  About 2 weeks ago had onset of fatigue requiring a nap after work, mild stiff neck.  He had a headache but that has resolved.  He never had a fever or other symptoms.  Lab Results  Component Value Date   CREATININE 0.98 05/29/2019   BUN 15 05/29/2019   NA 141 05/29/2019   K 4.9 05/29/2019   CL 102 05/29/2019   CO2 25 05/29/2019   Lab Results  Component Value Date   CHOL 132 10/21/2018   HDL 44 10/21/2018   LDLCALC 68 10/21/2018   TRIG 98 10/21/2018   CHOLHDL 3.0 10/21/2018   Lab Results  Component Value Date   TSH 1.758 11/08/2018   Lab Results  Component Value Date   HGBA1C 5.1 10/21/2018   Lab Results  Component Value Date   WBC 6.7 05/29/2019   HGB 15.3 05/29/2019   HCT 43.9 05/29/2019   MCV 90 05/29/2019   PLT 219 05/29/2019   Lab Results  Component Value Date   ALT 12 05/29/2019   AST 12 05/29/2019   ALKPHOS 64 05/29/2019   BILITOT 0.3 05/29/2019     Review of Systems  Constitutional: Positive for fatigue. Negative for chills and fever.  HENT: Negative for trouble swallowing.   Respiratory: Negative for cough, shortness of breath and wheezing.   Cardiovascular: Negative for chest pain.  Gastrointestinal: Negative for abdominal pain, diarrhea, nausea and vomiting.  Musculoskeletal: Positive for neck stiffness.  Neurological: Negative for dizziness, light-headedness and headaches (resolved).    Patient Active Problem List   Diagnosis Date Noted  . Atrial fibrillation ([place]) 11/17/2018  . Major depression in partial remission ([place]) 09/17/2018  . Chronic pain of right ankle  09/17/2018  . Mild hyperlipidemia 10/07/2017  . Environmental and seasonal allergies 08/07/2016  . Hypotestosteronism 11/02/2015  . Intermittent palpitations 07/27/2015  . Gastroesophageal reflux disease 07/27/2015  . Asthma, mild intermittent 07/27/2015  . Vitamin D deficiency 07/27/2015  . Obesity, Class I, BMI 30-34.9 07/26/2015    No Known Allergies  Past Surgical History:  Procedure Laterality Date  . CARDIAC ELECTROPHYSIOLOGY STUDY AND ABLATION  08/2019  . COLONOSCOPY WITH PROPOFOL N/A 11/08/2018   Procedure: COLONOSCOPY WITH BIOPSY;  Surgeon: Lucilla Lame, MD;  Location: [facility];  Service: Endoscopy;  Laterality: N/A;  . POLYPECTOMY N/A 11/08/2018   Procedure: POLYPECTOMY;  Surgeon: Lucilla Lame, MD;  Location: [place];  Service: Endoscopy;  Laterality: N/A;  . TREATMENT FISTULA ANAL      Social History   Tobacco Use  . Smoking status: Never Smoker  . Smokeless tobacco: Never Used  Vaping Use  . Vaping Use: Never used  Substance Use Topics  . Alcohol use: Yes    Alcohol/week: 1.0 standard drink    Types: 1 Cans of beer per week  . Drug use: No     Medication list has been reviewed and updated.  Current Meds  Medication Sig  . albuterol (PROAIR HFA) 108 (90 Base) MCG/ACT inhaler Inhale 2 puffs into the lungs every 6 (six)  hours as needed for wheezing or shortness of breath.  Marland Kitchen albuterol (PROVENTIL) (2.5 MG/3ML) 0.083% nebulizer solution Take 3 mLs (2.5 mg total) by nebulization every 6 (six) hours as needed for wheezing or shortness of breath.  Marland Kitchen apixaban (ELIQUIS) 5 MG TABS tablet Take 1 tablet (5 mg total) by mouth 2 (two) times daily.  . cetirizine (ZYRTEC) 10 MG tablet Take 10 mg by mouth daily.  . fluticasone (FLONASE) 50 MCG/ACT nasal spray Place into both nostrils daily.  . tadalafil (CIALIS) 20 MG tablet Take 1 tablet (20 mg total) by mouth daily as needed for erectile dysfunction.    PHQ 2/9 Scores 02/09/2020 01/07/2020 05/29/2019  11/18/2018  PHQ - 2 Score 0 0 0 0  PHQ- 9 Score 3 0 0 0    GAD 7 : Generalized Anxiety Score 02/09/2020 05/29/2019 07/02/2017  Nervous, Anxious, on Edge 0 0 2  Control/stop worrying 0 0 0  Worry too much - different things 0 0 0  Trouble relaxing 0 0 1  Restless 0 0 1  Easily annoyed or irritable 0 0 2  Afraid - awful might happen 0 0 0  Total GAD 7 Score 0 0 6  Anxiety Difficulty Not difficult at all Not difficult at all Somewhat difficult    BP Readings from Last 3 Encounters:  02/09/20 128/70  01/07/20 134/78  05/29/19 116/78    Physical Exam Vitals and nursing note reviewed.  Constitutional:      General: He is not in acute distress.    Appearance: Normal appearance. He is well-developed.  HENT:     Head: Normocephalic and atraumatic.  Cardiovascular:     Rate and Rhythm: Normal rate and regular rhythm.  Pulmonary:     Effort: Pulmonary effort is normal. No respiratory distress.     Breath sounds: No wheezing or rhonchi.  Musculoskeletal:     Cervical back: Normal range of motion. No rigidity, spasms, torticollis or crepitus. Normal range of motion.     Right lower leg: No edema.     Left lower leg: No edema.  Lymphadenopathy:     Cervical: No cervical adenopathy.  Skin:    General: Skin is warm and dry.     Capillary Refill: Capillary refill takes less than 2 seconds.     Findings: No lesion or rash.  Neurological:     General: No focal deficit present.     Mental Status: He is alert and oriented to person, place, and time.  Psychiatric:        Mood and Affect: Mood normal.     Wt Readings from Last 3 Encounters:  02/09/20 219 lb (99.3 kg)  01/07/20 218 lb (98.9 kg)  05/29/19 200 lb (90.7 kg)    BP 128/70 (BP Location: Right Arm, Patient Position: Sitting, Cuff Size: Large)   Pulse 74   Temp 98.2 F (36.8 C) (Oral)   Ht 5\' 9"  (1.753 m)   Wt 219 lb (99.3 kg)   SpO2 96%   BMI 32.34 kg/m   Assessment and Plan: 1. Tick-borne disease Likelihood is  low but will treat presumptively Take tylenol or Advil for headache, neck pain - doxycycline (VIBRA-TABS) 100 MG tablet; Take 1 tablet (100 mg total) by mouth 2 (two) times daily for 10 days.  Dispense: 20 tablet; Refill: 0   Partially dictated using Editor, commissioning. Any errors are unintentional.  Halina Maidens, MD [facility]  02/09/2020

## 2020-02-16 ENCOUNTER — Encounter: Payer: Self-pay | Admitting: Internal Medicine

## 2020-02-17 ENCOUNTER — Other Ambulatory Visit: Payer: Self-pay | Admitting: Internal Medicine

## 2020-02-17 DIAGNOSIS — B882 Other arthropod infestations: Secondary | ICD-10-CM

## 2020-02-17 NOTE — Telephone Encounter (Signed)
Called pt to see how he was feeling he said he was still having neck stiffness and fatigue. Told pt that it is best that he be seen. Tried to schedule appt but pt said he would call back when he knows his availability for next week. Pt verbalized understanding,  KP

## 2020-02-17 NOTE — Telephone Encounter (Signed)
Requested medications are due for refill today?  Yes - This medication is not assigned to a protocol.    Requested medications are on active medication list?  Yes  Last Refill:   02/09/2020  # 20 with no refills  Future visit scheduled?  No   Notes to Clinic:  This medication is not assigned to a protocol.

## 2020-03-23 ENCOUNTER — Ambulatory Visit: Payer: 59 | Admitting: Urology

## 2020-12-14 ENCOUNTER — Ambulatory Visit: Payer: 59 | Admitting: Urology

## 2020-12-14 ENCOUNTER — Encounter: Payer: Self-pay | Admitting: Urology

## 2020-12-14 ENCOUNTER — Other Ambulatory Visit: Payer: Self-pay

## 2020-12-14 ENCOUNTER — Other Ambulatory Visit
Admission: RE | Admit: 2020-12-14 | Discharge: 2020-12-14 | Disposition: A | Discharge status: Home / Self Care | Payer: 59 | Attending: Urology | Admitting: Urology

## 2020-12-14 VITALS — BP 147/90 | HR 70 | Ht 69.0 in | Wt 223.0 lb

## 2020-12-14 DIAGNOSIS — N401 Enlarged prostate with lower urinary tract symptoms: Secondary | ICD-10-CM | POA: Diagnosis not present

## 2020-12-14 DIAGNOSIS — N138 Other obstructive and reflux uropathy: Secondary | ICD-10-CM | POA: Diagnosis not present

## 2020-12-14 DIAGNOSIS — Z125 Encounter for screening for malignant neoplasm of prostate: Secondary | ICD-10-CM

## 2020-12-14 DIAGNOSIS — N529 Male erectile dysfunction, unspecified: Secondary | ICD-10-CM

## 2020-12-14 MED ORDER — TADALAFIL 20 MG PO TABS: 20.0000 mg | ORAL_TABLET | Freq: Every day | ORAL | 1 refills | Status: DC | PRN

## 2020-12-14 NOTE — Patient Instructions (Signed)
Prostate Cancer Screening  Prostate cancer screening is a test that is done to check for the presence of prostate cancer in men. The prostate gland is a walnut-sized gland that is located below the bladder and in front of the rectum in males. The function of the prostate is to add fluid to semen during ejaculation. Prostate cancer is the second most common type of cancer in men. Who should have prostate cancer screening?  Screening recommendations vary based on age and other risk factors. Screening is recommended if:  You are older than age 55. If you are age 55-69, talk with your health care provider about your need for screening and how often screening should be done. Because most prostate cancers are slow growing and will not cause death, screening is generally reserved in this age group for men who have a 10-15-year life expectancy.  You are younger than age 55, and you have these risk factors: ? Being a black male or a male of African descent. ? Having a father, brother, or uncle who has been diagnosed with prostate cancer. The risk is higher if your family member's cancer occurred at an early age. Screening is not recommended if:  You are younger than age 40.  You are between the ages of 40 and 54 and you have no risk factors.  You are 70 years of age or older. At this age, the risks that screening can cause are greater than the benefits that it may provide. If you are at high risk for prostate cancer, your health care provider may recommend that you have screenings more often or that you start screening at a younger age. How is screening for prostate cancer done? The recommended prostate cancer screening test is a blood test called the prostate-specific antigen (PSA) test. PSA is a protein that is made in the prostate. As you age, your prostate naturally produces more PSA. Abnormally high PSA levels may be caused by:  Prostate cancer.  An enlarged prostate that is not caused by cancer  (benign prostatic hyperplasia, BPH). This condition is very common in older men.  A prostate gland infection (prostatitis). Depending on the PSA results, you may need more tests, such as:  A physical exam to check the size of your prostate gland.  Blood and imaging tests.  A procedure to remove tissue samples from your prostate gland for testing (biopsy). What are the benefits of prostate cancer screening?  Screening can help to identify cancer at an early stage, before symptoms start and when the cancer can be treated more easily.  There is a small chance that screening may lower your risk of dying from prostate cancer. The chance is small because prostate cancer is a slow-growing cancer, and most men with prostate cancer die from a different cause. What are the risks of prostate cancer screening? The main risk of prostate cancer screening is diagnosing and treating prostate cancer that would never have caused any symptoms or problems. This is called overdiagnosisand overtreatment. PSA screening cannot tell you if your PSA is high due to cancer or a different cause. A prostate biopsy is the only procedure to diagnose prostate cancer. Even the results of a biopsy may not tell you if your cancer needs to be treated. Slow-growing prostate cancer may not need any treatment other than monitoring, so diagnosing and treating it may cause unnecessary stress or other side effects. A prostate biopsy may also cause:  Infection or fever.  A false negative. This is   a result that shows that you do not have prostate cancer when you actually do have prostate cancer. Questions to ask your health care provider  When should I start prostate cancer screening?  What is my risk for prostate cancer?  How often do I need screening?  What type of screening tests do I need?  How do I get my test results?  What do my results mean?  Do I need treatment? Where to find more information  The American Cancer  Society: www.cancer.org  American Urological Association: www.auanet.org Contact a health care provider if:  You have difficulty urinating.  You have pain when you urinate or ejaculate.  You have blood in your urine or semen.  You have pain in your back or in the area of your prostate. Summary  Prostate cancer is a common type of cancer in men. The prostate gland is located below the bladder and in front of the rectum. This gland adds fluid to semen during ejaculation.  Prostate cancer screening may identify cancer at an early stage, when the cancer can be treated more easily.  The prostate-specific antigen (PSA) test is the recommended screening test for prostate cancer.  Discuss the risks and benefits of prostate cancer screening with your health care provider. If you are age 42 or older, the risks that screening can cause are greater than the benefits that it may provide. This information is not intended to replace advice given to you by your health care provider. Make sure you discuss any questions you have with your health care provider. Document Revised: 12/05/2019 Document Reviewed: 03/27/2019 Elsevier Patient Education  [place].

## 2020-12-14 NOTE — Progress Notes (Signed)
   12/14/2020 12:42 PM   Ronald Fry 10-16-1960 [id number]  Reason for visit: Follow up ED, BPH, PSA screening  HPI: I saw [name] for the above issues.  He is a 60 year old male with ED well-controlled on 10 mg Cialis as needed.  He has mild urinary symptoms except for when he takes allergy medications and can notice some weaker stream.  He has had a couple episodes over the last 1 to 2 years of bedwetting of unclear etiology.  He has a previous diagnosis of sleep apnea, but does not use CPAP.  He had had some mildly elevated PSAs in the past, but on recheck most recently in July 2020 it was normal at 2.8.  He missed his yearly follow-up last year.  He is also had some left-sided scrotal swelling that is very mild but he wanted to get checked out as well.  On exam he has a small hydrocele versus epididymal cyst, but no testicular masses.  Reassurance was provided.  Consider ultrasound in the future if worsening symptoms or enlargement.  PSA today, call with results Cialis refilled 10 mg as needed RTC 1 year  Billey Co, MD  [facility] [address] [place], [place] [address] [phone]

## 2020-12-14 NOTE — Addendum Note (Signed)
Addended by: Despina Hidden on: 12/14/2020 04:12 PM   Modules accepted: Orders

## 2020-12-15 LAB — PSA (REFLEX TO FREE) (SERIAL): Prostate Specific Ag, Serum: 2.9 ng/mL (ref 0.0–4.0)

## 2020-12-17 ENCOUNTER — Telehealth: Payer: Self-pay

## 2020-12-17 NOTE — Telephone Encounter (Signed)
-----   Message from Billey Co, MD sent at 12/16/2020  1:00 PM EDT ----- Good news, PSA remains normal at 2.9, continue yearly follow-up  Nickolas Madrid, MD 12/16/2020

## 2020-12-27 ENCOUNTER — Ambulatory Visit: Payer: 59 | Admitting: Internal Medicine

## 2020-12-27 ENCOUNTER — Encounter: Payer: Self-pay | Admitting: Internal Medicine

## 2020-12-27 ENCOUNTER — Other Ambulatory Visit: Payer: Self-pay

## 2020-12-27 VITALS — BP 124/78 | HR 87 | Temp 98.3°F | Ht 69.0 in | Wt 221.0 lb

## 2020-12-27 DIAGNOSIS — H66003 Acute suppurative otitis media without spontaneous rupture of ear drum, bilateral: Secondary | ICD-10-CM

## 2020-12-27 DIAGNOSIS — K146 Glossodynia: Secondary | ICD-10-CM | POA: Diagnosis not present

## 2020-12-27 MED ORDER — AZITHROMYCIN 250 MG PO TABS: ORAL_TABLET | ORAL | 0 refills | Status: AC

## 2020-12-27 NOTE — Progress Notes (Signed)
Date:  12/27/2020   Name:  Ronald Fry   DOB:  May 10, 1961   MRN:  [id number]   Chief Complaint: Ear Fullness (Dizziness started this morning. Has hx of wax build up in ear. No ear pain. Bilateral ear fullness- feels " clogged up.")  Dizziness This is a new problem. The current episode started yesterday (concerned he may have impacted cerumen). The problem has been unchanged. Pertinent negatives include no arthralgias, congestion, coughing, fatigue, fever, headaches, nausea, sore throat, vertigo or vomiting. The symptoms are aggravated by twisting. Treatments tried: flonase and zyrtec. The treatment provided no relief.    Lab Results  Component Value Date   CREATININE 0.98 05/29/2019   BUN 15 05/29/2019   NA 141 05/29/2019   K 4.9 05/29/2019   CL 102 05/29/2019   CO2 25 05/29/2019   Lab Results  Component Value Date   CHOL 132 10/21/2018   HDL 44 10/21/2018   LDLCALC 68 10/21/2018   TRIG 98 10/21/2018   CHOLHDL 3.0 10/21/2018   Lab Results  Component Value Date   TSH 1.758 11/08/2018   Lab Results  Component Value Date   HGBA1C 5.1 10/21/2018   Lab Results  Component Value Date   WBC 6.7 05/29/2019   HGB 15.3 05/29/2019   HCT 43.9 05/29/2019   MCV 90 05/29/2019   PLT 219 05/29/2019   Lab Results  Component Value Date   ALT 12 05/29/2019   AST 12 05/29/2019   ALKPHOS 64 05/29/2019   BILITOT 0.3 05/29/2019     Review of Systems  Constitutional: Negative for fatigue and fever.  HENT: Positive for mouth sores (burning tongue off and on since January). Negative for congestion, dental problem, ear discharge and sore throat.   Respiratory: Negative for cough.   Gastrointestinal: Negative for nausea and vomiting.  Musculoskeletal: Negative for arthralgias.  Neurological: Positive for dizziness. Negative for vertigo and headaches.    Patient Active Problem List   Diagnosis Date Noted  . Atrial fibrillation ([place]) 11/17/2018  . 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 Chronic pain of right ankle 09/17/2018  . Mild hyperlipidemia 10/07/2017  . Environmental and seasonal allergies 08/07/2016  . Hypotestosteronism 11/02/2015  . Intermittent palpitations 07/27/2015  . Gastroesophageal reflux disease 07/27/2015  . Asthma, mild intermittent 07/27/2015  . Vitamin D deficiency 07/27/2015  . Obesity, Class I, BMI 30-34.9 07/26/2015    No Known Allergies  Past Surgical History:  Procedure Laterality Date  . CARDIAC ELECTROPHYSIOLOGY STUDY AND ABLATION  08/2019  . COLONOSCOPY WITH PROPOFOL N/A 11/08/2018   Procedure: COLONOSCOPY WITH BIOPSY;  Surgeon: Lucilla Lame, MD;  Location: [facility];  Service: Endoscopy;  Laterality: N/A;  . POLYPECTOMY N/A 11/08/2018   Procedure: POLYPECTOMY;  Surgeon: Lucilla Lame, MD;  Location: [facility];  Service: Endoscopy;  Laterality: N/A;  . TREATMENT FISTULA ANAL      Social History   Tobacco Use  . Smoking status: Never Smoker  . Smokeless tobacco: Never Used  Vaping Use  . Vaping Use: Never used  Substance Use Topics  . Alcohol use: Yes    Alcohol/week: 1.0 standard drink    Types: 1 Cans of beer per week  . Drug use: No     Medication list has been reviewed and updated.  Current Meds  Medication Sig  . albuterol (PROVENTIL) (2.5 MG/3ML) 0.083% nebulizer solution Take 3 mLs (2.5 mg total) by nebulization every 6 (six) hours as needed for wheezing or  shortness of breath.  . cetirizine (ZYRTEC) 10 MG tablet Take 10 mg by mouth daily.  Marland Kitchen diltiazem (CARDIZEM CD) 120 MG 24 hr capsule Take 1 capsule (120 mg total) by mouth daily.  . fluticasone (FLONASE) 50 MCG/ACT nasal spray Place into both nostrils daily.  . tadalafil (CIALIS) 20 MG tablet Take 1 tablet (20 mg total) by mouth daily as needed for erectile dysfunction.    PHQ 2/9 Scores 12/27/2020 02/09/2020 01/07/2020 05/29/2019  PHQ - 2 Score 0 0 0 0  PHQ- 9 Score 3 3 0 0    GAD 7 : Generalized Anxiety Score  12/27/2020 02/09/2020 05/29/2019 07/02/2017  Nervous, Anxious, on Edge 0 0 0 2  Control/stop worrying 0 0 0 0  Worry too much - different things 0 0 0 0  Trouble relaxing 0 0 0 1  Restless 0 0 0 1  Easily annoyed or irritable 0 0 0 2  Afraid - awful might happen 0 0 0 0  Total GAD 7 Score 0 0 0 6  Anxiety Difficulty - Not difficult at all Not difficult at all Somewhat difficult    BP Readings from Last 3 Encounters:  12/27/20 124/78  12/14/20 (!) 147/90  02/09/20 128/70    Physical Exam Constitutional:      Appearance: He is well-developed.  HENT:     Right Ear: Ear canal and external ear normal. No middle ear effusion. There is no impacted cerumen. Tympanic membrane is erythematous and retracted.     Left Ear: Ear canal and external ear normal. A middle ear effusion is present. Tympanic membrane is erythematous and retracted.     Nose:     Right Sinus: No maxillary sinus tenderness or frontal sinus tenderness.     Left Sinus: No maxillary sinus tenderness or frontal sinus tenderness.     Mouth/Throat:     Lips: Pink.     Mouth: Mucous membranes are moist. No oral lesions.     Tongue: No lesions.     Pharynx: No oropharyngeal exudate or posterior oropharyngeal erythema.  Cardiovascular:     Rate and Rhythm: Normal rate and regular rhythm.     Heart sounds: Normal heart sounds.  Pulmonary:     Breath sounds: Normal breath sounds. No wheezing or rales.  Lymphadenopathy:     Cervical: No cervical adenopathy.  Neurological:     Mental Status: He is alert and oriented to person, place, and time.     Wt Readings from Last 3 Encounters:  12/27/20 221 lb (100.2 kg)  12/14/20 223 lb (101.2 kg)  02/09/20 219 lb (99.3 kg)    BP 124/78   Pulse 87   Temp 98.3 F (36.8 C) (Oral)   Ht 5\' 9"  (1.753 m)   Wt 221 lb (100.2 kg)   SpO2 96%   BMI 32.64 kg/m   Assessment and Plan: 1. Non-recurrent acute suppurative otitis media of both ears without spontaneous rupture of tympanic  membranes Continue flonase and zyrtec - azithromycin (ZITHROMAX Z-PAK) 250 MG tablet; UAD  Dispense: 6 each; Refill: 0  2. Tongue burning sensation Pt mentions at the end of the visit - he will try a multivitamin/multimineral. Will discuss further at [facility] next month   Partially dictated using [place]. Any errors are unintentional.  Halina Maidens, MD [facility]  12/27/2020

## 2021-01-09 ENCOUNTER — Encounter: Payer: Self-pay | Admitting: Internal Medicine

## 2021-01-10 ENCOUNTER — Other Ambulatory Visit: Payer: Self-pay | Admitting: Internal Medicine

## 2021-01-10 DIAGNOSIS — H66003 Acute suppurative otitis media without spontaneous rupture of ear drum, bilateral: Secondary | ICD-10-CM

## 2021-01-10 MED ORDER — PREDNISONE 10 MG PO TABS: 10.0000 mg | ORAL_TABLET | ORAL | 0 refills | Status: AC

## 2021-03-17 ENCOUNTER — Ambulatory Visit (INDEPENDENT_AMBULATORY_CARE_PROVIDER_SITE_OTHER): Payer: 59 | Admitting: Internal Medicine

## 2021-03-17 ENCOUNTER — Other Ambulatory Visit: Payer: Self-pay

## 2021-03-17 ENCOUNTER — Encounter: Payer: Self-pay | Admitting: Internal Medicine

## 2021-03-17 VITALS — BP 112/80 | HR 72 | Temp 98.0°F | Ht 69.0 in | Wt 222.0 lb

## 2021-03-17 DIAGNOSIS — Z8679 Personal history of other diseases of the circulatory system: Secondary | ICD-10-CM

## 2021-03-17 DIAGNOSIS — I10 Essential (primary) hypertension: Secondary | ICD-10-CM

## 2021-03-17 DIAGNOSIS — Z0000 Encounter for general adult medical examination without abnormal findings: Secondary | ICD-10-CM

## 2021-03-17 DIAGNOSIS — E785 Hyperlipidemia, unspecified: Secondary | ICD-10-CM

## 2021-03-17 DIAGNOSIS — Z6832 Body mass index (BMI) 32.0-32.9, adult: Secondary | ICD-10-CM | POA: Diagnosis not present

## 2021-03-17 DIAGNOSIS — G4733 Obstructive sleep apnea (adult) (pediatric): Secondary | ICD-10-CM | POA: Insufficient documentation

## 2021-03-17 DIAGNOSIS — Z9889 Other specified postprocedural states: Secondary | ICD-10-CM | POA: Diagnosis not present

## 2021-03-17 MED ORDER — DILTIAZEM HCL ER COATED BEADS 120 MG PO CP24
120.0000 mg | ORAL_CAPSULE | Freq: Every day | ORAL | 3 refills | Status: DC
Start: 2021-03-17 — End: 2021-05-24

## 2021-03-17 NOTE — Progress Notes (Signed)
Date:  03/17/2021   Name:  Ronald Fry   DOB:  [birth year]   MRN:  [id number]   Chief Complaint: Annual Exam Ronald Fry is a 60 y.o. male who presents today for his Complete Annual Exam. He feels well. He reports exercising - walking with job. He reports he is sleeping well.   Colonoscopy: 10/2018 repeat 10 yrs  Immunization History  Administered Date(s) Administered   Influenza,inj,Quad PF,6+ Mos 08/07/2016, 07/12/2018, 05/29/2019   Influenza-Unspecified 05/25/2015, 05/28/2017, 07/12/2018   PFIZER(Purple Top)SARS-COV-2 Vaccination 11/23/2019, 12/17/2019, 06/28/2020, 01/14/2021   Pneumococcal-Unspecified 08/28/2012   Tdap 08/28/2010, 08/29/2011   Zoster Recombinat (Shingrix) 12/10/2016, 03/11/2017   Zoster, Live 08/28/2013    Hypertension This is a chronic problem. The problem is controlled (at home 128/85). Pertinent negatives include no chest pain, headaches, palpitations or shortness of breath. Past treatments include calcium channel blockers. The current treatment provides significant improvement.  Afib - resolved after ablation.  He has been released from Cardiology routine care.  Lab Results  Component Value Date   CREATININE 0.98 05/29/2019   BUN 15 05/29/2019   NA 141 05/29/2019   K 4.9 05/29/2019   CL 102 05/29/2019   CO2 25 05/29/2019   Lab Results  Component Value Date   CHOL 132 10/21/2018   HDL 44 10/21/2018   LDLCALC 68 10/21/2018   TRIG 98 10/21/2018   CHOLHDL 3.0 10/21/2018   Lab Results  Component Value Date   TSH 1.758 11/08/2018   Lab Results  Component Value Date   HGBA1C 5.1 10/21/2018   Lab Results  Component Value Date   WBC 6.7 05/29/2019   HGB 15.3 05/29/2019   HCT 43.9 05/29/2019   MCV 90 05/29/2019   PLT 219 05/29/2019   Lab Results  Component Value Date   ALT 12 05/29/2019   AST 12 05/29/2019   ALKPHOS 64 05/29/2019   BILITOT 0.3 05/29/2019     Review of Systems  Constitutional:  Negative for appetite change,  chills, diaphoresis, fatigue and unexpected weight change.  HENT:  Positive for hearing loss. Negative for tinnitus, trouble swallowing and voice change.   Eyes:  Negative for visual disturbance.  Respiratory:  Negative for choking, shortness of breath and wheezing.   Cardiovascular:  Negative for chest pain, palpitations and leg swelling.  Gastrointestinal:  Negative for abdominal pain, blood in stool, constipation and diarrhea.  Genitourinary:  Negative for difficulty urinating, dysuria and frequency.  Musculoskeletal:  Negative for arthralgias, back pain and myalgias.  Skin:  Negative for color change and rash.  Allergic/Immunologic: Positive for environmental allergies.  Neurological:  Negative for dizziness, syncope and headaches.  Hematological:  Negative for adenopathy.  Psychiatric/Behavioral:  Negative for dysphoric mood and sleep disturbance. The patient is not nervous/anxious.    Patient Active Problem List   Diagnosis Date Noted   OSA (obstructive sleep apnea) 03/17/2021   Atrial fibrillation ([place]) 11/17/2018   Mild hyperlipidemia 10/07/2017   Environmental and seasonal allergies 08/07/2016   Hypotestosteronism 11/02/2015   Intermittent palpitations 07/27/2015   Gastroesophageal reflux disease 07/27/2015   Asthma, mild intermittent 07/27/2015   Obesity, Class I, BMI 30-34.9 07/26/2015    No Known Allergies  Past Surgical History:  Procedure Laterality Date   CARDIAC ELECTROPHYSIOLOGY STUDY AND ABLATION  08/2019   COLONOSCOPY WITH PROPOFOL N/A 11/08/2018   Procedure: COLONOSCOPY WITH BIOPSY;  Surgeon: Lucilla Lame, MD;  Location: [facility];  Service: Endoscopy;  Laterality: N/A;   POLYPECTOMY N/A 11/08/2018   Procedure:  POLYPECTOMY;  Surgeon: Lucilla Lame, MD;  Location: [facility];  Service: Endoscopy;  Laterality: N/A;   TREATMENT FISTULA ANAL      Social History   Tobacco Use   Smoking status: Never   Smokeless tobacco: Never  Vaping Use    Vaping Use: Never used  Substance Use Topics   Alcohol use: Yes    Alcohol/week: 1.0 standard drink    Types: 1 Cans of beer per week   Drug use: No     Medication list has been reviewed and updated.  Current Meds  Medication Sig   albuterol (PROVENTIL) (2.5 MG/3ML) 0.083% nebulizer solution Take 3 mLs (2.5 mg total) by nebulization every 6 (six) hours as needed for wheezing or shortness of breath.   cetirizine (ZYRTEC) 10 MG tablet Take 10 mg by mouth daily.   diltiazem (CARDIZEM CD) 120 MG 24 hr capsule Take 1 capsule (120 mg total) by mouth daily.   etodolac (LODINE) 500 MG tablet Take 500 mg by mouth 2 (two) times daily.   fluticasone (FLONASE) 50 MCG/ACT nasal spray Place into both nostrils daily.   tadalafil (CIALIS) 20 MG tablet Take 1 tablet (20 mg total) by mouth daily as needed for erectile dysfunction.    PHQ 2/9 Scores 03/17/2021 12/27/2020 02/09/2020 01/07/2020  PHQ - 2 Score 0 0 0 0  PHQ- 9 Score 0 3 3 0    GAD 7 : Generalized Anxiety Score 03/17/2021 12/27/2020 02/09/2020 05/29/2019  Nervous, Anxious, on Edge 0 0 0 0  Control/stop worrying 0 0 0 0  Worry too much - different things 0 0 0 0  Trouble relaxing 0 0 0 0  Restless 0 0 0 0  Easily annoyed or irritable 0 0 0 0  Afraid - awful might happen 0 0 0 0  Total GAD 7 Score 0 0 0 0  Anxiety Difficulty - - Not difficult at all Not difficult at all    BP Readings from Last 3 Encounters:  03/17/21 112/80  12/27/20 124/78  12/14/20 (!) 147/90    Physical Exam Vitals and nursing note reviewed.  Constitutional:      Appearance: Normal appearance. He is well-developed.  HENT:     Head: Normocephalic.     Right Ear: Tympanic membrane, ear canal and external ear normal.     Left Ear: Tympanic membrane, ear canal and external ear normal.     Nose: Nose normal.  Eyes:     Conjunctiva/sclera: Conjunctivae normal.     Pupils: Pupils are equal, round, and reactive to light.  Neck:     Thyroid: No thyromegaly.      Vascular: No carotid bruit.  Cardiovascular:     Rate and Rhythm: Normal rate and regular rhythm.     Heart sounds: Normal heart sounds.  Pulmonary:     Effort: Pulmonary effort is normal.     Breath sounds: Normal breath sounds. No wheezing.  Chest:  Breasts:    Right: No mass.     Left: No mass.  Abdominal:     General: Bowel sounds are normal.     Palpations: Abdomen is soft.     Tenderness: There is no abdominal tenderness.  Musculoskeletal:        General: Normal range of motion.     Cervical back: Normal range of motion and neck supple.  Lymphadenopathy:     Cervical: No cervical adenopathy.  Skin:    General: Skin is warm and dry.  Neurological:  Mental Status: He is alert and oriented to person, place, and time.     Deep Tendon Reflexes: Reflexes are normal and symmetric.  Psychiatric:        Attention and Perception: Attention normal.        Mood and Affect: Mood normal.        Thought Content: Thought content normal.    Wt Readings from Last 3 Encounters:  03/17/21 222 lb (100.7 kg)  12/27/20 221 lb (100.2 kg)  12/14/20 223 lb (101.2 kg)    BP 112/80 (BP Location: Right Arm, Patient Position: Sitting, Cuff Size: Large)   Pulse 72   Temp 98 F (36.7 C) (Oral)   Ht 5\' 9"  (1.753 m)   Wt 222 lb (100.7 kg)   SpO2 96%   BMI 32.78 kg/m   Assessment and Plan: 1. Annual physical exam Exam is normal except for weight. Encourage regular exercise and appropriate dietary changes. - CBC with Differential/Platelet - Comprehensive metabolic panel - Hemoglobin A1c  2. Essential hypertension Clinically stable exam with well controlled BP. Tolerating medications without side effects at this time. Pt to continue current regimen and low sodium diet; benefits of regular exercise as able discussed. - diltiazem (CARDIZEM CD) 120 MG 24 hr capsule; Take 1 capsule (120 mg total) by mouth daily.  Dispense: 90 capsule; Refill: 3  3. Mild hyperlipidemia Check labs and  advise - Lipid panel  4. BMI 32.0-32.9,adult Continue to work on diet changes, exercise and weight loss  5. S/P ablation of atrial fibrillation Doing well Follow up with Cardiology as needed only   Partially dictated using [place]. Any errors are unintentional.  Halina Maidens, MD [facility]  03/17/2021

## 2021-03-18 LAB — CBC WITH DIFFERENTIAL/PLATELET
Basophils Absolute: 0.1 10*3/uL (ref 0.0–0.2)
Basos: 1 %
EOS (ABSOLUTE): 0.1 10*3/uL (ref 0.0–0.4)
Eos: 1 %
Hematocrit: 47.7 % (ref 37.5–51.0)
Hemoglobin: 16.1 g/dL (ref 13.0–17.7)
Immature Grans (Abs): 0 10*3/uL (ref 0.0–0.1)
Immature Granulocytes: 0 %
Lymphocytes Absolute: 2 10*3/uL (ref 0.7–3.1)
Lymphs: 27 %
MCH: 30.6 pg (ref 26.6–33.0)
MCHC: 33.8 g/dL (ref 31.5–35.7)
MCV: 91 fL (ref 79–97)
Monocytes Absolute: 0.5 10*3/uL (ref 0.1–0.9)
Monocytes: 7 %
Neutrophils Absolute: 4.8 10*3/uL (ref 1.4–7.0)
Neutrophils: 64 %
Platelets: 227 10*3/uL (ref 150–450)
RBC: 5.27 x10E6/uL (ref 4.14–5.80)
RDW: 13.1 % (ref 11.6–15.4)
WBC: 7.5 10*3/uL (ref 3.4–10.8)

## 2021-03-18 LAB — COMPREHENSIVE METABOLIC PANEL
ALT: 17 IU/L (ref 0–44)
AST: 27 IU/L (ref 0–40)
Albumin/Globulin Ratio: 2.2 (ref 1.2–2.2)
Albumin: 4.7 g/dL (ref 3.8–4.8)
Alkaline Phosphatase: 72 IU/L (ref 44–121)
BUN/Creatinine Ratio: 13 (ref 10–24)
BUN: 13 mg/dL (ref 8–27)
Bilirubin Total: 0.6 mg/dL (ref 0.0–1.2)
CO2: 24 mmol/L (ref 20–29)
Calcium: 9.2 mg/dL (ref 8.6–10.2)
Chloride: 101 mmol/L (ref 96–106)
Creatinine, Ser: 0.98 mg/dL (ref 0.76–1.27)
Globulin, Total: 2.1 g/dL (ref 1.5–4.5)
Glucose: 100 mg/dL — ABNORMAL HIGH (ref 65–99)
Potassium: 4.6 mmol/L (ref 3.5–5.2)
Sodium: 139 mmol/L (ref 134–144)
Total Protein: 6.8 g/dL (ref 6.0–8.5)
eGFR: 88 mL/min/{1.73_m2} (ref >59)

## 2021-03-18 LAB — HEMOGLOBIN A1C
Est. average glucose Bld gHb Est-mCnc: 103 mg/dL
Hgb A1c MFr Bld: 5.2 % (ref 4.8–5.6)

## 2021-03-18 LAB — LIPID PANEL
Chol/HDL Ratio: 4.9 ratio (ref 0.0–5.0)
Cholesterol, Total: 190 mg/dL (ref 100–199)
HDL: 39 mg/dL — ABNORMAL LOW (ref >39)
LDL Chol Calc (NIH): 127 mg/dL — ABNORMAL HIGH (ref 0–99)
Triglycerides: 133 mg/dL (ref 0–149)
VLDL Cholesterol Cal: 24 mg/dL (ref 5–40)

## 2021-03-24 ENCOUNTER — Encounter: Payer: Self-pay | Admitting: Internal Medicine

## 2021-05-24 ENCOUNTER — Encounter: Payer: Self-pay | Admitting: Internal Medicine

## 2021-05-24 ENCOUNTER — Ambulatory Visit: Payer: 59 | Admitting: Internal Medicine

## 2021-05-24 ENCOUNTER — Other Ambulatory Visit: Payer: Self-pay

## 2021-05-24 VITALS — BP 112/70 | HR 74 | Ht 69.0 in | Wt 221.0 lb

## 2021-05-24 DIAGNOSIS — I10 Essential (primary) hypertension: Secondary | ICD-10-CM | POA: Diagnosis not present

## 2021-05-24 MED ORDER — DILTIAZEM HCL ER COATED BEADS 120 MG PO CP24: 120.0000 mg | ORAL_CAPSULE | Freq: Two times a day (BID) | ORAL | 3 refills | Status: DC

## 2021-05-24 NOTE — Progress Notes (Signed)
Date:  05/24/2021   Name:  Ronald Fry   DOB:  Nov 10, 1960   MRN:  [id number]   Chief Complaint: Hypertension  Hypertension This is a chronic problem. The current episode started more than 1 year ago. The problem has been gradually worsening since onset. The problem is controlled. Pertinent negatives include no chest pain, headaches, palpitations or shortness of breath. Past treatments include calcium channel blockers (increased cardiazem to bid). The current treatment provides significant improvement. There is no history of kidney disease, CAD/MI or CVA. hx of cardiac ablation.   Lab Results  Component Value Date   CREATININE 0.98 03/17/2021   BUN 13 03/17/2021   NA 139 03/17/2021   K 4.6 03/17/2021   CL 101 03/17/2021   CO2 24 03/17/2021   Lab Results  Component Value Date   CHOL 190 03/17/2021   HDL 39 (L) 03/17/2021   LDLCALC 127 (H) 03/17/2021   TRIG 133 03/17/2021   CHOLHDL 4.9 03/17/2021   Lab Results  Component Value Date   TSH 1.758 11/08/2018   Lab Results  Component Value Date   HGBA1C 5.2 03/17/2021   Lab Results  Component Value Date   WBC 7.5 03/17/2021   HGB 16.1 03/17/2021   HCT 47.7 03/17/2021   MCV 91 03/17/2021   PLT 227 03/17/2021   Lab Results  Component Value Date   ALT 17 03/17/2021   AST 27 03/17/2021   ALKPHOS 72 03/17/2021   BILITOT 0.6 03/17/2021     Review of Systems  Constitutional:  Negative for fatigue and unexpected weight change.  HENT:  Negative for nosebleeds.   Eyes:  Negative for visual disturbance.  Respiratory:  Negative for cough, chest tightness, shortness of breath and wheezing.   Cardiovascular:  Negative for chest pain, palpitations and leg swelling.  Gastrointestinal:  Negative for abdominal pain, constipation and diarrhea.  Neurological:  Negative for dizziness, weakness, light-headedness and headaches.  Psychiatric/Behavioral:  Negative for dysphoric mood and sleep disturbance. The patient is not  nervous/anxious.    Patient Active Problem List   Diagnosis Date Noted   OSA (obstructive sleep apnea) 03/17/2021   S/P ablation of atrial fibrillation 11/17/2018   Mild hyperlipidemia 10/07/2017   Environmental and seasonal allergies 08/07/2016   Hypotestosteronism 11/02/2015   Intermittent palpitations 07/27/2015   Gastroesophageal reflux disease 07/27/2015   Asthma, mild intermittent 07/27/2015   Essential hypertension 07/26/2015   Obesity, Class I, BMI 30-34.9 07/26/2015    No Known Allergies  Past Surgical History:  Procedure Laterality Date   CARDIAC ELECTROPHYSIOLOGY STUDY AND ABLATION  08/2019   COLONOSCOPY WITH PROPOFOL N/A 11/08/2018   Procedure: COLONOSCOPY WITH BIOPSY;  Surgeon: Lucilla Lame, MD;  Location: [place];  Service: Endoscopy;  Laterality: N/A;   POLYPECTOMY N/A 11/08/2018   Procedure: POLYPECTOMY;  Surgeon: Lucilla Lame, MD;  Location: [place];  Service: Endoscopy;  Laterality: N/A;   TREATMENT FISTULA ANAL      Social History   Tobacco Use   Smoking status: Never   Smokeless tobacco: Never  Vaping Use   Vaping Use: Never used  Substance Use Topics   Alcohol use: Yes    Alcohol/week: 1.0 standard drink    Types: 1 Cans of beer per week   Drug use: No     Medication list has been reviewed and updated.  Current Meds  Medication Sig   albuterol (PROVENTIL) (2.5 MG/3ML) 0.083% nebulizer solution Take 3 mLs (2.5 mg total) by nebulization every 6 (  six) hours as needed for wheezing or shortness of breath.   cetirizine (ZYRTEC) 10 MG tablet Take 10 mg by mouth daily.   diltiazem (CARDIZEM CD) 120 MG 24 hr capsule Take 1 capsule (120 mg total) by mouth daily. (Patient taking differently: Take 120 mg by mouth in the morning and at bedtime.)   etodolac (LODINE) 500 MG tablet Take 500 mg by mouth 2 (two) times daily.   fluticasone (FLONASE) 50 MCG/ACT nasal spray Place into both nostrils daily.   tadalafil (CIALIS) 20 MG tablet Take  1 tablet (20 mg total) by mouth daily as needed for erectile dysfunction.    PHQ 2/9 Scores 05/24/2021 03/17/2021 12/27/2020 02/09/2020  PHQ - 2 Score 0 0 0 0  PHQ- 9 Score 0 0 3 3    GAD 7 : Generalized Anxiety Score 05/24/2021 03/17/2021 12/27/2020 02/09/2020  Nervous, Anxious, on Edge 0 0 0 0  Control/stop worrying 0 0 0 0  Worry too much - different things 0 0 0 0  Trouble relaxing 0 0 0 0  Restless 0 0 0 0  Easily annoyed or irritable 0 0 0 0  Afraid - awful might happen 0 0 0 0  Total GAD 7 Score 0 0 0 0  Anxiety Difficulty Not difficult at all - - Not difficult at all    BP Readings from Last 3 Encounters:  05/24/21 112/70  03/17/21 112/80  12/27/20 124/78    Physical Exam Vitals and nursing note reviewed.  Constitutional:      General: He is not in acute distress.    Appearance: He is well-developed.  HENT:     Head: Normocephalic and atraumatic.  Cardiovascular:     Rate and Rhythm: Normal rate and regular rhythm.     Pulses: Normal pulses.  Pulmonary:     Effort: Pulmonary effort is normal. No respiratory distress.     Breath sounds: No wheezing or rhonchi.  Musculoskeletal:     Right lower leg: No edema.     Left lower leg: No edema.  Skin:    General: Skin is warm and dry.     Findings: No rash.  Neurological:     Mental Status: He is alert and oriented to person, place, and time.  Psychiatric:        Mood and Affect: Mood normal.        Behavior: Behavior normal.    Wt Readings from Last 3 Encounters:  05/24/21 221 lb (100.2 kg)  03/17/21 222 lb (100.7 kg)  12/27/20 221 lb (100.2 kg)    BP 112/70   Pulse 74   Ht 5\' 9"  (1.753 m)   Wt 221 lb (100.2 kg)   SpO2 96%   BMI 32.64 kg/m   Assessment and Plan: 1. Essential hypertension Clinically stable exam with well controlled BP since increasing dose to bid. Tolerating medications without side effects at this time. Pt to continue current regimen and low sodium diet; he has plans to begin more regular  exercise. Monitor BP at home and return if there are concerns. - diltiazem (CARDIZEM CD) 120 MG 24 hr capsule; Take 1 capsule (120 mg total) by mouth 2 (two) times daily.  Dispense: 180 capsule; Refill: 3   Partially dictated using Editor, commissioning. Any errors are unintentional.  Halina Maidens, MD [facility]  05/24/2021

## 2021-06-08 ENCOUNTER — Encounter: Payer: Self-pay | Admitting: General Surgery

## 2022-03-21 ENCOUNTER — Ambulatory Visit (INDEPENDENT_AMBULATORY_CARE_PROVIDER_SITE_OTHER): Payer: 59 | Admitting: Internal Medicine

## 2022-03-21 ENCOUNTER — Encounter: Payer: Self-pay | Admitting: Internal Medicine

## 2022-03-21 VITALS — BP 118/60 | HR 73 | Ht 69.0 in | Wt 228.0 lb

## 2022-03-21 DIAGNOSIS — Z125 Encounter for screening for malignant neoplasm of prostate: Secondary | ICD-10-CM

## 2022-03-21 DIAGNOSIS — I10 Essential (primary) hypertension: Secondary | ICD-10-CM

## 2022-03-21 DIAGNOSIS — E785 Hyperlipidemia, unspecified: Secondary | ICD-10-CM | POA: Diagnosis not present

## 2022-03-21 DIAGNOSIS — Z0000 Encounter for general adult medical examination without abnormal findings: Secondary | ICD-10-CM | POA: Diagnosis not present

## 2022-03-21 DIAGNOSIS — E559 Vitamin D deficiency, unspecified: Secondary | ICD-10-CM | POA: Diagnosis not present

## 2022-03-21 DIAGNOSIS — J4520 Mild intermittent asthma, uncomplicated: Secondary | ICD-10-CM | POA: Diagnosis not present

## 2022-03-21 DIAGNOSIS — M722 Plantar fascial fibromatosis: Secondary | ICD-10-CM

## 2022-03-21 MED ORDER — DILTIAZEM HCL ER COATED BEADS 120 MG PO CP24: 120.0000 mg | ORAL_CAPSULE | Freq: Two times a day (BID) | ORAL | 1 refills | Status: DC

## 2022-03-21 MED ORDER — ALBUTEROL SULFATE HFA 108 (90 BASE) MCG/ACT IN AERS: 2.0000 | INHALATION_SPRAY | Freq: Four times a day (QID) | RESPIRATORY_TRACT | 5 refills | Status: DC | PRN

## 2022-03-21 MED ORDER — ETODOLAC 500 MG PO TABS: 500.0000 mg | ORAL_TABLET | Freq: Two times a day (BID) | ORAL | 1 refills | Status: DC

## 2022-03-21 NOTE — Progress Notes (Signed)
Date:  03/21/2022   Name:  Ronald Fry   DOB:  1961/07/21   MRN:  [id number]   Chief Complaint: Annual Exam Ronald Fry is a 61 y.o. male who presents today for his Complete Annual Exam. He feels well. He reports swimming for exercise. He reports he is sleeping well.   Colonoscopy: 10/2018 repeat 10 yrs  Immunization History  Administered Date(s) Administered   Influenza,inj,Quad PF,6+ Mos 08/07/2016, 07/12/2018, 05/29/2019   Influenza-Unspecified 05/25/2015, 05/28/2017, 07/12/2018   PFIZER(Purple Top)SARS-COV-2 Vaccination 11/23/2019, 12/17/2019, 06/28/2020, 01/14/2021   PNEUMOCOCCAL CONJUGATE-20 01/14/2021   Pneumococcal-Unspecified 08/28/2012   Tdap 08/28/2010, 08/29/2011, 01/14/2021   Zoster Recombinat (Shingrix) 12/10/2016, 03/11/2017   Zoster, Live 08/28/2013   There are no preventive care reminders to display for this patient.   Lab Results  Component Value Date   PSA1 2.9 12/14/2020   PSA1 2.8 03/18/2019   PSA1 3.9 02/10/2019    Hypertension This is a chronic problem. The problem is controlled. Pertinent negatives include no chest pain, headaches, palpitations or shortness of breath. Past treatments include calcium channel blockers. The current treatment provides significant improvement. Hypertensive end-organ damage includes CAD/MI (hx of Afib s/p ablation).    Lab Results  Component Value Date   NA 139 03/17/2021   K 4.6 03/17/2021   CO2 24 03/17/2021   GLUCOSE 100 (H) 03/17/2021   BUN 13 03/17/2021   CREATININE 0.98 03/17/2021   CALCIUM 9.2 03/17/2021   EGFR 88 03/17/2021   GFRNONAA 84 05/29/2019   Lab Results  Component Value Date   CHOL 190 03/17/2021   HDL 39 (L) 03/17/2021   LDLCALC 127 (H) 03/17/2021   TRIG 133 03/17/2021   CHOLHDL 4.9 03/17/2021   Lab Results  Component Value Date   TSH 1.758 11/08/2018   Lab Results  Component Value Date   HGBA1C 5.2 03/17/2021   Lab Results  Component Value Date   WBC 7.5 03/17/2021   HGB  16.1 03/17/2021   HCT 47.7 03/17/2021   MCV 91 03/17/2021   PLT 227 03/17/2021   Lab Results  Component Value Date   ALT 17 03/17/2021   AST 27 03/17/2021   ALKPHOS 72 03/17/2021   BILITOT 0.6 03/17/2021   Lab Results  Component Value Date   VD25OH 26.4 (L) 11/01/2015     Review of Systems  Constitutional:  Positive for fatigue. Negative for appetite change, chills, diaphoresis and unexpected weight change.  HENT:  Positive for sinus pressure. Negative for hearing loss, tinnitus, trouble swallowing and voice change.   Eyes:  Negative for visual disturbance.  Respiratory:  Negative for choking, shortness of breath and wheezing.   Cardiovascular:  Negative for chest pain, palpitations and leg swelling.  Gastrointestinal:  Positive for abdominal pain (episodic gall bladder symptoms). Negative for blood in stool, constipation and diarrhea.  Genitourinary:  Negative for difficulty urinating, dysuria and frequency.  Musculoskeletal:  Negative for arthralgias, back pain and myalgias.  Skin:  Negative for color change and rash.  Neurological:  Negative for dizziness, syncope and headaches.  Hematological:  Negative for adenopathy.  Psychiatric/Behavioral:  Negative for dysphoric mood and sleep disturbance. The patient is not nervous/anxious.     Patient Active Problem List   Diagnosis Date Noted   OSA (obstructive sleep apnea) 03/17/2021   S/P ablation of atrial fibrillation 11/17/2018   Mild hyperlipidemia 10/07/2017   Environmental and seasonal allergies 08/07/2016   Hypotestosteronism 11/02/2015   Intermittent palpitations 07/27/2015   Gastroesophageal reflux disease 07/27/2015  Asthma, mild intermittent 07/27/2015   Essential hypertension 07/26/2015   Obesity, Class I, BMI 30-34.9 07/26/2015    No Known Allergies  Past Surgical History:  Procedure Laterality Date   CARDIAC ELECTROPHYSIOLOGY STUDY AND ABLATION  08/2019   COLONOSCOPY WITH PROPOFOL N/A 11/08/2018    Procedure: COLONOSCOPY WITH BIOPSY;  Surgeon: Lucilla Lame, MD;  Location: [place];  Service: Endoscopy;  Laterality: N/A;   POLYPECTOMY N/A 11/08/2018   Procedure: POLYPECTOMY;  Surgeon: Lucilla Lame, MD;  Location: [place];  Service: Endoscopy;  Laterality: N/A;   TREATMENT FISTULA ANAL      Social History   Tobacco Use   Smoking status: Never   Smokeless tobacco: Never  Vaping Use   Vaping Use: Never used  Substance Use Topics   Alcohol use: Yes    Alcohol/week: 1.0 standard drink of alcohol    Types: 1 Cans of beer per week   Drug use: No     Medication list has been reviewed and updated.  Current Meds  Medication Sig   albuterol (PROVENTIL) (2.5 MG/3ML) 0.083% nebulizer solution Take 3 mLs (2.5 mg total) by nebulization every 6 (six) hours as needed for wheezing or shortness of breath.   cetirizine (ZYRTEC) 10 MG tablet Take 10 mg by mouth daily.   diltiazem (CARDIZEM CD) 120 MG 24 hr capsule Take 1 capsule (120 mg total) by mouth 2 (two) times daily.   etodolac (LODINE) 500 MG tablet Take 500 mg by mouth 2 (two) times daily.   fluticasone (FLONASE) 50 MCG/ACT nasal spray Place into both nostrils daily.   tadalafil (CIALIS) 20 MG tablet Take 1 tablet (20 mg total) by mouth daily as needed for erectile dysfunction.       03/21/2022    8:12 AM 05/24/2021   10:56 AM 03/17/2021   10:19 AM 12/27/2020    1:50 PM  GAD 7 : Generalized Anxiety Score  Nervous, Anxious, on Edge 0 0 0 0  Control/stop worrying 0 0 0 0  Worry too much - different things 0 0 0 0  Trouble relaxing 0 0 0 0  Restless 0 0 0 0  Easily annoyed or irritable 0 0 0 0  Afraid - awful might happen 0 0 0 0  Total GAD 7 Score 0 0 0 0  Anxiety Difficulty Not difficult at all Not difficult at all         03/21/2022    8:12 AM 05/24/2021   10:56 AM 03/17/2021   10:18 AM  Depression screen PHQ 2/9  Decreased Interest 0 0 0  Down, Depressed, Hopeless 0 0 0  PHQ - 2 Score 0 0 0  Altered  sleeping 2 0 0  Tired, decreased energy 0 0 0  Change in appetite 0 0 0  Feeling bad or failure about yourself  0 0 0  Trouble concentrating 0 0 0  Moving slowly or fidgety/restless 0 0 0  Suicidal thoughts 0 0 0  PHQ-9 Score 2 0 0  Difficult doing work/chores Not difficult at all Not difficult at all Not difficult at all    BP Readings from Last 3 Encounters:  03/21/22 118/60  05/24/21 112/70  03/17/21 112/80    Physical Exam Vitals and nursing note reviewed.  Constitutional:      Appearance: Normal appearance. He is well-developed.  HENT:     Head: Normocephalic.     Right Ear: Tympanic membrane, ear canal and external ear normal.     Left  Ear: Tympanic membrane, ear canal and external ear normal.     Nose: Nose normal.  Eyes:     Conjunctiva/sclera: Conjunctivae normal.     Pupils: Pupils are equal, round, and reactive to light.  Neck:     Thyroid: No thyromegaly.     Vascular: No carotid bruit.  Cardiovascular:     Rate and Rhythm: Normal rate and regular rhythm.     Heart sounds: Normal heart sounds.  Pulmonary:     Effort: Pulmonary effort is normal.     Breath sounds: Normal breath sounds. No wheezing.  Chest:  Breasts:    Right: No mass.     Left: No mass.  Abdominal:     General: Bowel sounds are normal.     Palpations: Abdomen is soft.     Tenderness: There is no abdominal tenderness.  Musculoskeletal:        General: Normal range of motion.     Cervical back: Normal range of motion and neck supple.     Right lower leg: No edema.     Left lower leg: No edema.  Lymphadenopathy:     Cervical: No cervical adenopathy.  Skin:    General: Skin is warm and dry.     Capillary Refill: Capillary refill takes less than 2 seconds.  Neurological:     General: No focal deficit present.     Mental Status: He is alert and oriented to person, place, and time.     Deep Tendon Reflexes: Reflexes are normal and symmetric.  Psychiatric:        Attention and  Perception: Attention normal.        Mood and Affect: Mood normal.        Thought Content: Thought content normal.     Wt Readings from Last 3 Encounters:  03/21/22 228 lb (103.4 kg)  05/24/21 221 lb (100.2 kg)  03/17/21 222 lb (100.7 kg)    BP 118/60   Pulse 73   Ht $R'5\' 9"'YO$  (1.753 m)   Wt 228 lb (103.4 kg)   SpO2 95%   BMI 33.67 kg/m   Assessment and Plan: 1. Annual physical exam Exam is normal except for weight. Encourage regular exercise and appropriate dietary changes. Up to date on screenings and immunizations. Recent fatigue may be due to chronic allergy/sinus issues - seeing ENT this afternoon - Hemoglobin A1c  2. Prostate cancer screening DRE deferred - PSA  3. Essential hypertension Clinically stable exam with well controlled BP. Tolerating medications without side effects at this time. Pt to continue current regimen and low sodium diet; benefits of regular exercise as able discussed. - CBC with Differential/Platelet - Comprehensive metabolic panel - Urinalysis, Routine w reflex microscopic - diltiazem (CARDIZEM CD) 120 MG 24 hr capsule; Take 1 capsule (120 mg total) by mouth 2 (two) times daily.  Dispense: 180 capsule; Refill: 1  4. Mild hyperlipidemia Check labs and advise - Lipid panel  5. Mild intermittent asthma without complication - albuterol (PROAIR HFA) 108 (90 Base) MCG/ACT inhaler; Inhale 2 puffs into the lungs every 6 (six) hours as needed for wheezing or shortness of breath.  Dispense: 18 g; Refill: 5  6. Vitamin D deficiency Not being supplemented - VITAMIN D 25 Hydroxy (Vit-D Deficiency, Fractures)  7. Plantar fasciitis of left foot Continue PRN Lodine - etodolac (LODINE) 500 MG tablet; Take 1 tablet (500 mg total) by mouth 2 (two) times daily.  Dispense: 180 tablet; Refill: 1   Partially dictated using  Editor, commissioning. Any errors are unintentional.  Halina Maidens, MD [facility]  03/21/2022

## 2022-03-22 ENCOUNTER — Encounter: Payer: Self-pay | Admitting: Internal Medicine

## 2022-03-22 LAB — COMPREHENSIVE METABOLIC PANEL
ALT: 19 IU/L (ref 0–44)
AST: 16 IU/L (ref 0–40)
Albumin/Globulin Ratio: 2.7 — ABNORMAL HIGH (ref 1.2–2.2)
Albumin: 4.8 g/dL (ref 3.9–4.9)
Alkaline Phosphatase: 71 IU/L (ref 44–121)
BUN/Creatinine Ratio: 13 (ref 10–24)
BUN: 13 mg/dL (ref 8–27)
Bilirubin Total: 0.5 mg/dL (ref 0.0–1.2)
CO2: 26 mmol/L (ref 20–29)
Calcium: 9.3 mg/dL (ref 8.6–10.2)
Chloride: 103 mmol/L (ref 96–106)
Creatinine, Ser: 1.02 mg/dL (ref 0.76–1.27)
Globulin, Total: 1.8 g/dL (ref 1.5–4.5)
Glucose: 109 mg/dL — ABNORMAL HIGH (ref 70–99)
Potassium: 4.6 mmol/L (ref 3.5–5.2)
Sodium: 142 mmol/L (ref 134–144)
Total Protein: 6.6 g/dL (ref 6.0–8.5)
eGFR: 83 mL/min/{1.73_m2} (ref >59)

## 2022-03-22 LAB — CBC WITH DIFFERENTIAL/PLATELET
Basophils Absolute: 0.1 10*3/uL (ref 0.0–0.2)
Basos: 1 %
EOS (ABSOLUTE): 0.1 10*3/uL (ref 0.0–0.4)
Eos: 1 %
Hematocrit: 48.4 % (ref 37.5–51.0)
Hemoglobin: 16.5 g/dL (ref 13.0–17.7)
Immature Grans (Abs): 0 10*3/uL (ref 0.0–0.1)
Immature Granulocytes: 0 %
Lymphocytes Absolute: 2 10*3/uL (ref 0.7–3.1)
Lymphs: 28 %
MCH: 30.7 pg (ref 26.6–33.0)
MCHC: 34.1 g/dL (ref 31.5–35.7)
MCV: 90 fL (ref 79–97)
Monocytes Absolute: 0.6 10*3/uL (ref 0.1–0.9)
Monocytes: 8 %
Neutrophils Absolute: 4.4 10*3/uL (ref 1.4–7.0)
Neutrophils: 62 %
Platelets: 235 10*3/uL (ref 150–450)
RBC: 5.37 x10E6/uL (ref 4.14–5.80)
RDW: 12.7 % (ref 11.6–15.4)
WBC: 7.1 10*3/uL (ref 3.4–10.8)

## 2022-03-22 LAB — HEMOGLOBIN A1C
Est. average glucose Bld gHb Est-mCnc: 105 mg/dL
Hgb A1c MFr Bld: 5.3 % (ref 4.8–5.6)

## 2022-03-22 LAB — URINALYSIS, ROUTINE W REFLEX MICROSCOPIC
Bilirubin, UA: NEGATIVE
Glucose, UA: NEGATIVE
Ketones, UA: NEGATIVE
Leukocytes,UA: NEGATIVE
Nitrite, UA: NEGATIVE
Protein,UA: NEGATIVE
RBC, UA: NEGATIVE
Specific Gravity, UA: 1.016 (ref 1.005–1.030)
Urobilinogen, Ur: 0.2 mg/dL (ref 0.2–1.0)
pH, UA: 6.5 (ref 5.0–7.5)

## 2022-03-22 LAB — LIPID PANEL
Chol/HDL Ratio: 4.6 ratio (ref 0.0–5.0)
Cholesterol, Total: 190 mg/dL (ref 100–199)
HDL: 41 mg/dL (ref >39)
LDL Chol Calc (NIH): 119 mg/dL — ABNORMAL HIGH (ref 0–99)
Triglycerides: 168 mg/dL — ABNORMAL HIGH (ref 0–149)
VLDL Cholesterol Cal: 30 mg/dL (ref 5–40)

## 2022-03-22 LAB — PSA: Prostate Specific Ag, Serum: 3.3 ng/mL (ref 0.0–4.0)

## 2022-03-22 LAB — VITAMIN D 25 HYDROXY (VIT D DEFICIENCY, FRACTURES): Vit D, 25-Hydroxy: 13.9 ng/mL — ABNORMAL LOW (ref 30.0–100.0)

## 2022-05-22 ENCOUNTER — Other Ambulatory Visit: Payer: Self-pay | Admitting: Internal Medicine

## 2022-05-22 ENCOUNTER — Telehealth (INDEPENDENT_AMBULATORY_CARE_PROVIDER_SITE_OTHER): Payer: 59 | Admitting: Internal Medicine

## 2022-05-22 ENCOUNTER — Encounter: Payer: Self-pay | Admitting: Internal Medicine

## 2022-05-22 ENCOUNTER — Ambulatory Visit: Payer: Self-pay | Admitting: Internal Medicine

## 2022-05-22 VITALS — Ht 69.0 in | Wt 228.0 lb

## 2022-05-22 DIAGNOSIS — U071 COVID-19: Secondary | ICD-10-CM | POA: Diagnosis not present

## 2022-05-22 MED ORDER — MOLNUPIRAVIR EUA 200MG CAPSULE: 4.0000 | ORAL_CAPSULE | Freq: Two times a day (BID) | ORAL | 0 refills | Status: AC

## 2022-05-22 MED ORDER — MOLNUPIRAVIR EUA 200MG CAPSULE: 4.0000 | ORAL_CAPSULE | Freq: Two times a day (BID) | ORAL | 0 refills | Status: DC

## 2022-05-22 NOTE — Progress Notes (Signed)
Date:  05/22/2022   Name:  Ronald Fry   DOB:  1960/12/14   MRN:  [id number]  This encounter was conducted via video encounter due to the need for social distancing in light of the Covid-19 pandemic.  The patient was correctly identified.  I advised that I am conducting the visit from a secure room in my office at [facility].  The patient is located at home. The limitations of this form of encounter were discussed with the patient and he/she agreed to proceed.  Some vital signs will be absent.  Chief Complaint: Covid Positive  Cough This is a new problem. The current episode started yesterday. The problem has been waxing and waning. The cough is Productive of sputum. Associated symptoms include chills, a fever (101.1), headaches, postnasal drip, rhinorrhea, a sore throat and wheezing. Pertinent negatives include no chest pain, myalgias or shortness of breath.  Tested positive for Covid this AM.  Took Advil and fever has decreased.  Lab Results  Component Value Date   NA 142 03/21/2022   K 4.6 03/21/2022   CO2 26 03/21/2022   GLUCOSE 109 (H) 03/21/2022   BUN 13 03/21/2022   CREATININE 1.02 03/21/2022   CALCIUM 9.3 03/21/2022   EGFR 83 03/21/2022   GFRNONAA 84 05/29/2019   Lab Results  Component Value Date   CHOL 190 03/21/2022   HDL 41 03/21/2022   LDLCALC 119 (H) 03/21/2022   TRIG 168 (H) 03/21/2022   CHOLHDL 4.6 03/21/2022   Lab Results  Component Value Date   TSH 1.758 11/08/2018   Lab Results  Component Value Date   HGBA1C 5.3 03/21/2022   Lab Results  Component Value Date   WBC 7.1 03/21/2022   HGB 16.5 03/21/2022   HCT 48.4 03/21/2022   MCV 90 03/21/2022   PLT 235 03/21/2022   Lab Results  Component Value Date   ALT 19 03/21/2022   AST 16 03/21/2022   ALKPHOS 71 03/21/2022   BILITOT 0.5 03/21/2022   Lab Results  Component Value Date   VD25OH 13.9 (L) 03/21/2022     Review of Systems  Constitutional:  Positive for chills and fever  (101.1).  HENT:  Positive for postnasal drip, rhinorrhea and sore throat.   Respiratory:  Positive for cough and wheezing. Negative for chest tightness and shortness of breath.   Cardiovascular:  Negative for chest pain and palpitations.  Gastrointestinal:  Negative for diarrhea, nausea and vomiting.  Musculoskeletal:  Negative for myalgias.  Neurological:  Positive for headaches.    Patient Active Problem List   Diagnosis Date Noted   OSA (obstructive sleep apnea) 03/17/2021   S/P ablation of atrial fibrillation 11/17/2018   Mild hyperlipidemia 10/07/2017   Environmental and seasonal allergies 08/07/2016   Hypotestosteronism 11/02/2015   Intermittent palpitations 07/27/2015   Gastroesophageal reflux disease 07/27/2015   Asthma, mild intermittent 07/27/2015   Vitamin D deficiency 07/27/2015   Essential hypertension 07/26/2015   Obesity, Class I, BMI 30-34.9 07/26/2015    No Known Allergies  Past Surgical History:  Procedure Laterality Date   CARDIAC ELECTROPHYSIOLOGY STUDY AND ABLATION  08/2019   COLONOSCOPY WITH PROPOFOL N/A 11/08/2018   Procedure: COLONOSCOPY WITH BIOPSY;  Surgeon: Lucilla Lame, MD;  Location: [place];  Service: Endoscopy;  Laterality: N/A;   POLYPECTOMY N/A 11/08/2018   Procedure: POLYPECTOMY;  Surgeon: Lucilla Lame, MD;  Location: [place];  Service: Endoscopy;  Laterality: N/A;   TREATMENT FISTULA ANAL  Social History   Tobacco Use   Smoking status: Never   Smokeless tobacco: Never  Vaping Use   Vaping Use: Never used  Substance Use Topics   Alcohol use: Yes    Alcohol/week: 1.0 standard drink of alcohol    Types: 1 Cans of beer per week   Drug use: No     Medication list has been reviewed and updated.  Current Meds  Medication Sig   albuterol (PROAIR HFA) 108 (90 Base) MCG/ACT inhaler Inhale 2 puffs into the lungs every 6 (six) hours as needed for wheezing or shortness of breath.   cetirizine (ZYRTEC) 10 MG tablet  Take 10 mg by mouth daily.   diltiazem (CARDIZEM CD) 120 MG 24 hr capsule Take 1 capsule (120 mg total) by mouth 2 (two) times daily.   etodolac (LODINE) 500 MG tablet Take 1 tablet (500 mg total) by mouth 2 (two) times daily.   tadalafil (CIALIS) 20 MG tablet Take 1 tablet (20 mg total) by mouth daily as needed for erectile dysfunction.   triamcinolone (NASACORT ALLERGY 24HR) 55 MCG/ACT AERO nasal inhaler Place 2 sprays into the nose daily.   [DISCONTINUED] fluticasone (FLONASE) 50 MCG/ACT nasal spray Place into both nostrils daily.       03/21/2022    8:12 AM 05/24/2021   10:56 AM 03/17/2021   10:19 AM 12/27/2020    1:50 PM  GAD 7 : Generalized Anxiety Score  Nervous, Anxious, on Edge 0 0 0 0  Control/stop worrying 0 0 0 0  Worry too much - different things 0 0 0 0  Trouble relaxing 0 0 0 0  Restless 0 0 0 0  Easily annoyed or irritable 0 0 0 0  Afraid - awful might happen 0 0 0 0  Total GAD 7 Score 0 0 0 0  Anxiety Difficulty Not difficult at all Not difficult at all         03/21/2022    8:12 AM 05/24/2021   10:56 AM 03/17/2021   10:18 AM  Depression screen PHQ 2/9  Decreased Interest 0 0 0  Down, Depressed, Hopeless 0 0 0  PHQ - 2 Score 0 0 0  Altered sleeping 2 0 0  Tired, decreased energy 0 0 0  Change in appetite 0 0 0  Feeling bad or failure about yourself  0 0 0  Trouble concentrating 0 0 0  Moving slowly or fidgety/restless 0 0 0  Suicidal thoughts 0 0 0  PHQ-9 Score 2 0 0  Difficult doing work/chores Not difficult at all Not difficult at all Not difficult at all    BP Readings from Last 3 Encounters:  03/21/22 118/60  05/24/21 112/70  03/17/21 112/80    Physical Exam Constitutional:      Appearance: Normal appearance.  Pulmonary:     Effort: Pulmonary effort is normal.     Comments: Occasional dry cough during the encounter Neurological:     General: No focal deficit present.     Mental Status: He is alert and oriented to person, place, and time.      Wt Readings from Last 3 Encounters:  05/22/22 228 lb (103.4 kg)  03/21/22 228 lb (103.4 kg)  05/24/21 221 lb (100.2 kg)    Ht _0  (1.753 m)   Wt 228 lb (103.4 kg)   BMI 33.67 kg/m   Assessment and Plan: 1. COVID-19 virus infection Take Tylenol 650 - 1000 mg every 6-8 hours for fever, body aches and  headache. Drink plenty of fluids with electrolytes. Monitor for fever that does not decrease and/or shortness of breath that worsens or is present at rest. Quarantine for 5 days. - molnupiravir EUA (LAGEVRIO) 200 mg CAPS capsule; Take 4 capsules (800 mg total) by mouth 2 (two) times daily for 5 days.  Dispense: 40 capsule; Refill: 0  I spent 9 minutes on this encounter, 99% by video. Partially dictated using Editor, commissioning. Any errors are unintentional.  Halina Maidens, MD [facility]  05/22/2022

## 2022-05-22 NOTE — Telephone Encounter (Signed)
Message from Luciana Axe sent at 05/22/2022  8:05 AM EDT  Summary: Positve Covid Advice   Pt is calling to report that he tested positive for covid today. Yesterday sx include chills, coughing, drainage temperature of 101.1. Please advise           Call History   Type Contact Phone/Fax User  05/22/2022 08:02 AM EDT Phone (Incoming) Nadine Counts, Dellis Filbert "Merry Proud" (Self) [phone] Jerilynn Mages) Blase Mess A   Reason for Disposition  [1] HIGH RISK patient (e.g., weak immune system, age > 69 years, obesity with BMI 30 or higher, pregnant, chronic lung disease or other chronic medical condition) AND [2] COVID symptoms (e.g., cough, fever)  (Exceptions: Already seen by PCP and no new or worsening symptoms.)  Answer Assessment - Initial Assessment Questions 1. COVID-19 DIAGNOSIS: "How do you know that you have COVID?" (e.g., positive lab test or self-test, diagnosed by doctor or NP/PA, symptoms after     Positive Covid 2. COVID-19 EXPOSURE: "Was there any known exposure to COVID before the symptoms began?" CDC Definition of close contact: within 6 feet (2 meters) for a total of 15 minutes or more over a 24-hour period.       3. ONSET: "When did the COVID-19 symptoms start?"      Yesterday  Coughing, fever 101.1 chills.  Nasal congestion and sore throat.   No ear pressure 4. WORST SYMPTOM: "What is your worst symptom?" (e.g., cough, fever, shortness of breath, muscle aches)      5. COUGH: "Do you have a cough?" If Yes, ask: "How bad is the cough?"       Coughing up mucus not sure of color 6. FEVER: "Do you have a fever?" If Yes, ask: "What is your temperature, how was it measured, and when did it start?"     101.1 7. RESPIRATORY STATUS: "Describe your breathing?" (e.g., normal; shortness of breath, wheezing, unable to speak)      I was wheezing last night per wife.    It cleared up after blowing my nose. 8. BETTER-SAME-WORSE: "Are you getting better, staying the same or getting worse compared to  yesterday?"  If getting worse, ask, "In what way?"     Not asked 9. OTHER SYMPTOMS: "Do you have any other symptoms?"  (e.g., chills, fatigue, headache, loss of smell or taste, muscle pain, sore throat)     Nasal congestion, sore throat. 10. HIGH RISK DISEASE: "Do you have any chronic medical problems?" (e.g., asthma, heart or lung disease, weak immune system, obesity, etc.)       Heart disease had an ablation  11. VACCINE: "Have you had the COVID-19 vaccine?" If Yes, ask: "Which one, how many shots, when did you get it?"       Not asked 12. PREGNANCY: "Is there any chance you are pregnant?" "When was your last menstrual period?"       N/A 13. O2 SATURATION MONITOR:  "Do you use an oxygen saturation monitor (pulse oximeter) at home?" If Yes, ask "What is your reading (oxygen level) today?" "What is your usual oxygen saturation reading?" (e.g., 95%)       N/A  Protocols used: Coronavirus (COVID-19) Diagnosed or Suspected-A-AH

## 2022-05-22 NOTE — Telephone Encounter (Signed)
  Chief Complaint: Covid positive this morning with home test Symptoms: started yesterday with coughing up mucus, chills, fever 101.1 last night, nasal congestion and sore throat. Frequency: Sx started Sunday Pertinent Negatives: Patient denies body aches or knowing who he was exposed to Disposition: '[]'$ ED /'[]'$ Urgent Care (no appt availability in office) / '[x]'$ Appointment(In office/virtual)/ '[]'$  [place] [facility]/ '[]'$ Home Care/ '[]'$ Refused Recommended Disposition /'[]'$ [place] [facility]/ '[]'$  Follow-up with PCP Additional Notes: Video appt made with Dr. Army Melia for today at 9:40.

## 2022-06-28 ENCOUNTER — Encounter: Payer: Self-pay | Admitting: Internal Medicine

## 2022-10-05 ENCOUNTER — Other Ambulatory Visit: Payer: Self-pay | Admitting: Internal Medicine

## 2022-10-05 ENCOUNTER — Encounter: Payer: Self-pay | Admitting: Internal Medicine

## 2022-10-05 DIAGNOSIS — I10 Essential (primary) hypertension: Secondary | ICD-10-CM

## 2022-10-05 NOTE — Telephone Encounter (Signed)
Requested Prescriptions  Pending Prescriptions Disp Refills   diltiazem (CARDIZEM CD) 120 MG 24 hr capsule [Pharmacy Med Name: DILTIAZEM ER (CD) CAPS '120MG'$ ] 180 capsule 0    Sig: TAKE 1 CAPSULE TWICE A DAY     Cardiovascular: Calcium Channel Blockers 3 Passed - 10/05/2022 12:48 AM      Passed - ALT in normal range and within 360 days    ALT  Date Value Ref Range Status  03/21/2022 19 0 - 44 IU/L Final         Passed - AST in normal range and within 360 days    AST  Date Value Ref Range Status  03/21/2022 16 0 - 40 IU/L Final         Passed - Cr in normal range and within 360 days    Creatinine, Ser  Date Value Ref Range Status  03/21/2022 1.02 0.76 - 1.27 mg/dL Final         Passed - Last BP in normal range    BP Readings from Last 1 Encounters:  03/21/22 118/60         Passed - Last Heart Rate in normal range    Pulse Readings from Last 1 Encounters:  03/21/22 73         Passed - Valid encounter within last 6 months    Recent Outpatient Visits           4 months ago COVID-19 virus infection   [place] [facility] at [facility], Jesse Sans, MD   6 months ago Annual physical exam   [facility] at [facility], Jesse Sans, MD   1 year ago Essential hypertension   [place] [facility] [place], Jesse Sans, [place]   1 year ago Annual physical exam   [place] at [facility], Jesse Sans, MD   1 year ago Non-recurrent acute suppurative otitis media of both ears without spontaneous rupture of tympanic membranes   [place] at [facility], Jesse Sans, MD       Future Appointments             Tomorrow Army Melia, Jesse Sans, MD [place] at [facility], [facility]   In 6 months Army Melia, Jesse Sans, MD [place] at  [facility], [facility]

## 2022-10-06 ENCOUNTER — Encounter: Payer: Self-pay | Admitting: Internal Medicine

## 2022-10-06 ENCOUNTER — Ambulatory Visit: Payer: 59 | Admitting: Internal Medicine

## 2022-10-06 VITALS — BP 122/74 | HR 71 | Temp 98.2°F | Ht 69.0 in | Wt 242.0 lb

## 2022-10-06 DIAGNOSIS — J4520 Mild intermittent asthma, uncomplicated: Secondary | ICD-10-CM

## 2022-10-06 DIAGNOSIS — I10 Essential (primary) hypertension: Secondary | ICD-10-CM

## 2022-10-06 MED ORDER — PROMETHAZINE-DM 6.25-15 MG/5ML PO SYRP: 5.0000 mL | ORAL_SOLUTION | Freq: Four times a day (QID) | ORAL | 0 refills | Status: AC | PRN

## 2022-10-06 MED ORDER — ALBUTEROL SULFATE (2.5 MG/3ML) 0.083% IN NEBU: 2.5000 mg | INHALATION_SOLUTION | Freq: Four times a day (QID) | RESPIRATORY_TRACT | 1 refills | Status: DC | PRN

## 2022-10-06 MED ORDER — AZITHROMYCIN 250 MG PO TABS: ORAL_TABLET | ORAL | 0 refills | Status: AC

## 2022-10-06 NOTE — Progress Notes (Signed)
Date:  10/06/2022   Name:  Ronald Fry   DOB:  April 08, 1961   MRN:  [id number]   Chief Complaint: Hypertension and Cough (X 1 week. Wants refill for albuterol nebulizer solution. )  Hypertension This is a chronic problem. The problem is controlled. Pertinent negatives include no chest pain, headaches, palpitations or shortness of breath. Past treatments include calcium channel blockers. The current treatment provides significant improvement. There is no history of kidney disease, CAD/MI or CVA.  Cough This is a recurrent problem. The current episode started in the past 7 days. The problem has been gradually worsening. The problem occurs every few minutes. The cough is Non-productive. Pertinent negatives include no chest pain, headaches, shortness of breath or wheezing. He has tried a beta-agonist inhaler for the symptoms. The treatment provided mild relief. His past medical history is significant for asthma.    Lab Results  Component Value Date   NA 142 03/21/2022   K 4.6 03/21/2022   CO2 26 03/21/2022   GLUCOSE 109 (H) 03/21/2022   BUN 13 03/21/2022   CREATININE 1.02 03/21/2022   CALCIUM 9.3 03/21/2022   EGFR 83 03/21/2022   GFRNONAA 84 05/29/2019   Lab Results  Component Value Date   CHOL 190 03/21/2022   HDL 41 03/21/2022   LDLCALC 119 (H) 03/21/2022   TRIG 168 (H) 03/21/2022   CHOLHDL 4.6 03/21/2022   Lab Results  Component Value Date   TSH 1.758 11/08/2018   Lab Results  Component Value Date   HGBA1C 5.3 03/21/2022   Lab Results  Component Value Date   WBC 7.1 03/21/2022   HGB 16.5 03/21/2022   HCT 48.4 03/21/2022   MCV 90 03/21/2022   PLT 235 03/21/2022   Lab Results  Component Value Date   ALT 19 03/21/2022   AST 16 03/21/2022   ALKPHOS 71 03/21/2022   BILITOT 0.5 03/21/2022   Lab Results  Component Value Date   VD25OH 13.9 (L) 03/21/2022     Review of Systems  Constitutional:  Negative for fatigue and unexpected weight change.  HENT:   Negative for nosebleeds and trouble swallowing.   Eyes:  Negative for visual disturbance.  Respiratory:  Positive for cough. Negative for chest tightness, shortness of breath and wheezing.   Cardiovascular:  Negative for chest pain, palpitations and leg swelling.  Gastrointestinal:  Negative for abdominal pain, constipation and diarrhea.  Neurological:  Negative for dizziness, weakness, light-headedness and headaches.  Psychiatric/Behavioral:  Positive for sleep disturbance.     Patient Active Problem List   Diagnosis Date Noted   OSA (obstructive sleep apnea) 03/17/2021   S/P ablation of atrial fibrillation 11/17/2018   Mild hyperlipidemia 10/07/2017   Environmental and seasonal allergies 08/07/2016   Hypotestosteronism 11/02/2015   Intermittent palpitations 07/27/2015   Gastroesophageal reflux disease 07/27/2015   Asthma, mild intermittent 07/27/2015   Vitamin D deficiency 07/27/2015   Essential hypertension 07/26/2015   Obesity, Class I, BMI 30-34.9 07/26/2015    No Known Allergies  Past Surgical History:  Procedure Laterality Date   CARDIAC ELECTROPHYSIOLOGY STUDY AND ABLATION  08/2019   COLONOSCOPY WITH PROPOFOL N/A 11/08/2018   Procedure: COLONOSCOPY WITH BIOPSY;  Surgeon: Lucilla Lame, MD;  Location: [place];  Service: Endoscopy;  Laterality: N/A;   POLYPECTOMY N/A 11/08/2018   Procedure: POLYPECTOMY;  Surgeon: Lucilla Lame, MD;  Location: [place];  Service: Endoscopy;  Laterality: N/A;   TREATMENT FISTULA ANAL      Social History  Tobacco Use   Smoking status: Never   Smokeless tobacco: Never  Vaping Use   Vaping Use: Never used  Substance Use Topics   Alcohol use: Yes    Alcohol/week: 1.0 standard drink of alcohol    Types: 1 Cans of beer per week   Drug use: No     Medication list has been reviewed and updated.  Current Meds  Medication Sig   albuterol (PROAIR HFA) 108 (90 Base) MCG/ACT inhaler Inhale 2 puffs into the lungs every  6 (six) hours as needed for wheezing or shortness of breath.   cetirizine (ZYRTEC) 10 MG tablet Take 10 mg by mouth daily.   diltiazem (CARDIZEM CD) 120 MG 24 hr capsule TAKE 1 CAPSULE TWICE A DAY   etodolac (LODINE) 500 MG tablet Take 1 tablet (500 mg total) by mouth 2 (two) times daily.   tadalafil (CIALIS) 20 MG tablet Take 1 tablet (20 mg total) by mouth daily as needed for erectile dysfunction.   triamcinolone (NASACORT ALLERGY 24HR) 55 MCG/ACT AERO nasal inhaler Place 2 sprays into the nose daily.       10/06/2022    8:59 AM 03/21/2022    8:12 AM 05/24/2021   10:56 AM 03/17/2021   10:19 AM  GAD 7 : Generalized Anxiety Score  Nervous, Anxious, on Edge 0 0 0 0  Control/stop worrying 0 0 0 0  Worry too much - different things 0 0 0 0  Trouble relaxing 0 0 0 0  Restless 0 0 0 0  Easily annoyed or irritable 0 0 0 0  Afraid - awful might happen 0 0 0 0  Total GAD 7 Score 0 0 0 0  Anxiety Difficulty Not difficult at all Not difficult at all Not difficult at all        10/06/2022    8:59 AM 03/21/2022    8:12 AM 05/24/2021   10:56 AM  Depression screen PHQ 2/9  Decreased Interest 0 0 0  Down, Depressed, Hopeless 0 0 0  PHQ - 2 Score 0 0 0  Altered sleeping 0 2 0  Tired, decreased energy 0 0 0  Change in appetite 0 0 0  Feeling bad or failure about yourself  0 0 0  Trouble concentrating 0 0 0  Moving slowly or fidgety/restless 0 0 0  Suicidal thoughts 0 0 0  PHQ-9 Score 0 2 0  Difficult doing work/chores Not difficult at all Not difficult at all Not difficult at all    BP Readings from Last 3 Encounters:  10/06/22 122/74  03/21/22 118/60  05/24/21 112/70    Physical Exam Vitals and nursing note reviewed.  Constitutional:      General: He is not in acute distress.    Appearance: Normal appearance. He is well-developed.  HENT:     Head: Normocephalic and atraumatic.     Right Ear: Tympanic membrane is scarred.     Left Ear: Tympanic membrane is scarred.     Nose:      Right Sinus: No maxillary sinus tenderness or frontal sinus tenderness.     Left Sinus: No maxillary sinus tenderness or frontal sinus tenderness.     Mouth/Throat:     Pharynx: Oropharynx is clear.  Cardiovascular:     Rate and Rhythm: Normal rate and regular rhythm.  Pulmonary:     Effort: Pulmonary effort is normal. No respiratory distress.     Breath sounds: Normal breath sounds. No decreased breath sounds or wheezing.  Musculoskeletal:     Cervical back: Normal range of motion.  Skin:    General: Skin is warm and dry.     Findings: No rash.  Neurological:     Mental Status: He is alert and oriented to person, place, and time.  Psychiatric:        Mood and Affect: Mood normal.        Behavior: Behavior normal.     Wt Readings from Last 3 Encounters:  10/06/22 242 lb (109.8 kg)  05/22/22 228 lb (103.4 kg)  03/21/22 228 lb (103.4 kg)    BP 122/74   Pulse 71   Temp 98.2 F (36.8 C) (Oral)   Ht 5' 9"$  (1.753 m)   Wt 242 lb (109.8 kg)   SpO2 98%   BMI 35.74 kg/m   Assessment and Plan: Problem List Items Addressed This Visit       Cardiovascular and Mediastinum   Essential hypertension - Primary (Chronic)    Clinically stable exam with well controlled BP on cardiazem. Tolerating medications without side effects. Pt to continue current regimen and low sodium diet.         Respiratory   Asthma, mild intermittent (Chronic)    Recent flare with cough and drainage Ear fullness but no fever      Relevant Medications   azithromycin (ZITHROMAX Z-PAK) 250 MG tablet   albuterol (PROVENTIL) (2.5 MG/3ML) 0.083% nebulizer solution   promethazine-dextromethorphan (PROMETHAZINE-DM) 6.25-15 MG/5ML syrup     Partially dictated using Editor, commissioning. Any errors are unintentional.  Halina Maidens, MD [facility]  10/06/2022

## 2022-10-06 NOTE — Assessment & Plan Note (Signed)
Recent flare with cough and drainage Ear fullness but no fever

## 2022-10-06 NOTE — Assessment & Plan Note (Signed)
Clinically stable exam with well controlled BP on cardiazem. Tolerating medications without side effects. Pt to continue current regimen and low sodium diet.

## 2022-10-13 ENCOUNTER — Ambulatory Visit: Payer: 59 | Admitting: Internal Medicine

## 2022-10-13 ENCOUNTER — Encounter: Payer: Self-pay | Admitting: Internal Medicine

## 2022-10-13 VITALS — BP 122/78 | HR 85 | Temp 97.9°F | Ht 69.0 in | Wt 241.0 lb

## 2022-10-13 DIAGNOSIS — J4520 Mild intermittent asthma, uncomplicated: Secondary | ICD-10-CM

## 2022-10-13 MED ORDER — BUDESONIDE-FORMOTEROL FUMARATE 160-4.5 MCG/ACT IN AERO: 2.0000 | INHALATION_SPRAY | Freq: Two times a day (BID) | RESPIRATORY_TRACT | 5 refills | Status: DC

## 2022-10-13 MED ORDER — PREDNISONE 10 MG PO TABS: ORAL_TABLET | ORAL | 0 refills | Status: AC

## 2022-10-13 NOTE — Assessment & Plan Note (Addendum)
Recent flare treated with Zpak and Albuterol but symptoms persist Will treat with steroid taper and follow with symbicort for one month; can continue if helpful Follow up if no improvement

## 2022-10-13 NOTE — Progress Notes (Signed)
Date:  10/13/2022   Name:  Ronald Fry   DOB:  May 15, 1961   MRN:  [id number]   Chief Complaint: Cough  Cough This is a recurrent problem. The current episode started 1 to 4 weeks ago. The problem has been waxing and waning. The problem occurs constantly. The cough is Productive of sputum. Associated symptoms include headaches, postnasal drip, a sore throat and wheezing. Pertinent negatives include no chest pain, chills, ear pain or fever.    Lab Results  Component Value Date   NA 142 03/21/2022   K 4.6 03/21/2022   CO2 26 03/21/2022   GLUCOSE 109 (H) 03/21/2022   BUN 13 03/21/2022   CREATININE 1.02 03/21/2022   CALCIUM 9.3 03/21/2022   EGFR 83 03/21/2022   GFRNONAA 84 05/29/2019   Lab Results  Component Value Date   CHOL 190 03/21/2022   HDL 41 03/21/2022   LDLCALC 119 (H) 03/21/2022   TRIG 168 (H) 03/21/2022   CHOLHDL 4.6 03/21/2022   Lab Results  Component Value Date   TSH 1.758 11/08/2018   Lab Results  Component Value Date   HGBA1C 5.3 03/21/2022   Lab Results  Component Value Date   WBC 7.1 03/21/2022   HGB 16.5 03/21/2022   HCT 48.4 03/21/2022   MCV 90 03/21/2022   PLT 235 03/21/2022   Lab Results  Component Value Date   ALT 19 03/21/2022   AST 16 03/21/2022   ALKPHOS 71 03/21/2022   BILITOT 0.5 03/21/2022   Lab Results  Component Value Date   VD25OH 13.9 (L) 03/21/2022     Review of Systems  Constitutional:  Positive for fatigue. Negative for chills, diaphoresis and fever.  HENT:  Positive for postnasal drip and sore throat. Negative for ear pain.   Respiratory:  Positive for cough, chest tightness and wheezing.   Cardiovascular:  Negative for chest pain.  Neurological:  Positive for headaches. Negative for dizziness.    Patient Active Problem List   Diagnosis Date Noted   OSA (obstructive sleep apnea) 03/17/2021   S/P ablation of atrial fibrillation 11/17/2018   Mild hyperlipidemia 10/07/2017   Environmental and seasonal  allergies 08/07/2016   Hypotestosteronism 11/02/2015   Intermittent palpitations 07/27/2015   Gastroesophageal reflux disease 07/27/2015   Asthma, mild intermittent 07/27/2015   Vitamin D deficiency 07/27/2015   Essential hypertension 07/26/2015   Obesity, Class I, BMI 30-34.9 07/26/2015    No Known Allergies  Past Surgical History:  Procedure Laterality Date   CARDIAC ELECTROPHYSIOLOGY STUDY AND ABLATION  08/2019   COLONOSCOPY WITH PROPOFOL N/A 11/08/2018   Procedure: COLONOSCOPY WITH BIOPSY;  Surgeon: Lucilla Lame, MD;  Location: [place];  Service: Endoscopy;  Laterality: N/A;   POLYPECTOMY N/A 11/08/2018   Procedure: POLYPECTOMY;  Surgeon: Lucilla Lame, MD;  Location: [place];  Service: Endoscopy;  Laterality: N/A;   TREATMENT FISTULA ANAL      Social History   Tobacco Use   Smoking status: Never   Smokeless tobacco: Never  Vaping Use   Vaping Use: Never used  Substance Use Topics   Alcohol use: Yes    Alcohol/week: 1.0 standard drink of alcohol    Types: 1 Cans of beer per week   Drug use: No     Medication list has been reviewed and updated.  Current Meds  Medication Sig   albuterol (PROAIR HFA) 108 (90 Base) MCG/ACT inhaler Inhale 2 puffs into the lungs every 6 (six) hours as needed for wheezing or  shortness of breath.   albuterol (PROVENTIL) (2.5 MG/3ML) 0.083% nebulizer solution Take 3 mLs (2.5 mg total) by nebulization every 6 (six) hours as needed for wheezing or shortness of breath.   budesonide-formoterol (SYMBICORT) 160-4.5 MCG/ACT inhaler Inhale 2 puffs into the lungs 2 (two) times daily.   cetirizine (ZYRTEC) 10 MG tablet Take 10 mg by mouth daily.   diltiazem (CARDIZEM CD) 120 MG 24 hr capsule TAKE 1 CAPSULE TWICE A DAY   etodolac (LODINE) 500 MG tablet Take 1 tablet (500 mg total) by mouth 2 (two) times daily.   predniSONE (DELTASONE) 10 MG tablet Take 6 tablets (60 mg total) by mouth daily with breakfast for 1 day, THEN 5 tablets  (50 mg total) daily with breakfast for 1 day, THEN 4 tablets (40 mg total) daily with breakfast for 1 day, THEN 3 tablets (30 mg total) daily with breakfast for 1 day, THEN 2 tablets (20 mg total) daily with breakfast for 1 day, THEN 1 tablet (10 mg total) daily with breakfast for 1 day.   promethazine-dextromethorphan (PROMETHAZINE-DM) 6.25-15 MG/5ML syrup Take 5 mLs by mouth 4 (four) times daily as needed for up to 9 days for cough.   tadalafil (CIALIS) 20 MG tablet Take 1 tablet (20 mg total) by mouth daily as needed for erectile dysfunction.   triamcinolone (NASACORT ALLERGY 24HR) 55 MCG/ACT AERO nasal inhaler Place 2 sprays into the nose daily.       10/13/2022    8:49 AM 10/06/2022    8:59 AM 03/21/2022    8:12 AM 05/24/2021   10:56 AM  GAD 7 : Generalized Anxiety Score  Nervous, Anxious, on Edge 0 0 0 0  Control/stop worrying 0 0 0 0  Worry too much - different things 0 0 0 0  Trouble relaxing 0 0 0 0  Restless 0 0 0 0  Easily annoyed or irritable 0 0 0 0  Afraid - awful might happen 0 0 0 0  Total GAD 7 Score 0 0 0 0  Anxiety Difficulty Not difficult at all Not difficult at all Not difficult at all Not difficult at all       10/13/2022    8:48 AM 10/06/2022    8:59 AM 03/21/2022    8:12 AM  Depression screen PHQ 2/9  Decreased Interest 0 0 0  Down, Depressed, Hopeless 0 0 0  PHQ - 2 Score 0 0 0  Altered sleeping 0 0 2  Tired, decreased energy 0 0 0  Change in appetite 0 0 0  Feeling bad or failure about yourself  0 0 0  Trouble concentrating 0 0 0  Moving slowly or fidgety/restless 0 0 0  Suicidal thoughts 0 0 0  PHQ-9 Score 0 0 2  Difficult doing work/chores Not difficult at all Not difficult at all Not difficult at all    BP Readings from Last 3 Encounters:  10/13/22 122/78  10/06/22 122/74  03/21/22 118/60    Physical Exam Constitutional:      Appearance: Normal appearance.  Cardiovascular:     Rate and Rhythm: Normal rate and regular rhythm.     Pulses:  Normal pulses.     Heart sounds: No murmur heard. Pulmonary:     Effort: Pulmonary effort is normal.     Breath sounds: No wheezing or rhonchi.  Musculoskeletal:     Cervical back: Normal range of motion.  Lymphadenopathy:     Cervical: No cervical adenopathy.  Skin:    General:  Skin is warm and dry.  Neurological:     General: No focal deficit present.     Mental Status: He is alert.     Wt Readings from Last 3 Encounters:  10/13/22 241 lb (109.3 kg)  10/06/22 242 lb (109.8 kg)  05/22/22 228 lb (103.4 kg)    BP 122/78   Pulse 85   Temp 97.9 F (36.6 C) (Oral)   Ht 5' 9"$  (1.753 m)   Wt 241 lb (109.3 kg)   SpO2 97%   BMI 35.59 kg/m   Assessment and Plan: Problem List Items Addressed This Visit       Respiratory   Asthma, mild intermittent - Primary (Chronic)    Recent flare treated with Zpak and Albuterol but symptoms persist Will treat with steroid taper and follow with symbicort for one month; can continue if helpful Follow up if no improvement      Relevant Medications   predniSONE (DELTASONE) 10 MG tablet   budesonide-formoterol (SYMBICORT) 160-4.5 MCG/ACT inhaler     Partially dictated using Editor, commissioning. Any errors are unintentional.  Halina Maidens, MD [facility]  10/13/2022

## 2023-01-03 ENCOUNTER — Other Ambulatory Visit: Payer: Self-pay | Admitting: Internal Medicine

## 2023-01-03 DIAGNOSIS — I10 Essential (primary) hypertension: Secondary | ICD-10-CM

## 2023-01-03 NOTE — Telephone Encounter (Signed)
Requested Prescriptions  Pending Prescriptions Disp Refills   diltiazem (CARDIZEM CD) 120 MG 24 hr capsule [Pharmacy Med Name: DILTIAZEM ER (CD) CAPS 120MG ] 180 capsule 0    Sig: TAKE 1 CAPSULE TWICE A DAY     Cardiovascular: Calcium Channel Blockers 3 Passed - 01/03/2023 12:41 AM      Passed - ALT in normal range and within 360 days    ALT  Date Value Ref Range Status  03/21/2022 19 0 - 44 IU/L Final         Passed - AST in normal range and within 360 days    AST  Date Value Ref Range Status  03/21/2022 16 0 - 40 IU/L Final         Passed - Cr in normal range and within 360 days    Creatinine, Ser  Date Value Ref Range Status  03/21/2022 1.02 0.76 - 1.27 mg/dL Final         Passed - Last BP in normal range    BP Readings from Last 1 Encounters:  10/13/22 122/78         Passed - Last Heart Rate in normal range    Pulse Readings from Last 1 Encounters:  10/13/22 85         Passed - Valid encounter within last 6 months    Recent Outpatient Visits           2 months ago Mild intermittent asthma without complication   [place] [facility] at [facility], Nyoka Cowden, MD   2 months ago Essential hypertension   [place] [facility] at [facility], Nyoka Cowden, MD   7 months ago COVID-19 virus infection   [facility] at [facility], Nyoka Cowden, MD   9 months ago Annual physical exam   [facility] at [facility], Nyoka Cowden, MD   1 year ago Essential hypertension   [place] [facility] at [facility], Nyoka Cowden, MD       Future Appointments             In 3 months Judithann Graves, Nyoka Cowden, MD [facility] at [facility], [facility]

## 2023-04-03 ENCOUNTER — Other Ambulatory Visit: Payer: Self-pay | Admitting: Internal Medicine

## 2023-04-03 DIAGNOSIS — I10 Essential (primary) hypertension: Secondary | ICD-10-CM

## 2023-04-09 ENCOUNTER — Encounter: Payer: Self-pay | Admitting: Internal Medicine

## 2023-04-09 ENCOUNTER — Ambulatory Visit (INDEPENDENT_AMBULATORY_CARE_PROVIDER_SITE_OTHER): Payer: 59 | Admitting: Internal Medicine

## 2023-04-09 VITALS — BP 112/72 | HR 76 | Ht 69.0 in | Wt 235.0 lb

## 2023-04-09 DIAGNOSIS — Z0000 Encounter for general adult medical examination without abnormal findings: Secondary | ICD-10-CM

## 2023-04-09 DIAGNOSIS — J4520 Mild intermittent asthma, uncomplicated: Secondary | ICD-10-CM | POA: Diagnosis not present

## 2023-04-09 DIAGNOSIS — Z125 Encounter for screening for malignant neoplasm of prostate: Secondary | ICD-10-CM

## 2023-04-09 DIAGNOSIS — E559 Vitamin D deficiency, unspecified: Secondary | ICD-10-CM

## 2023-04-09 DIAGNOSIS — I10 Essential (primary) hypertension: Secondary | ICD-10-CM | POA: Diagnosis not present

## 2023-04-09 DIAGNOSIS — E785 Hyperlipidemia, unspecified: Secondary | ICD-10-CM

## 2023-04-09 LAB — POCT URINALYSIS DIPSTICK
Bilirubin, UA: NEGATIVE
Blood, UA: NEGATIVE
Glucose, UA: NEGATIVE
Ketones, UA: NEGATIVE
Leukocytes, UA: NEGATIVE
Nitrite, UA: NEGATIVE
Protein, UA: NEGATIVE
Spec Grav, UA: 1.015 (ref 1.010–1.025)
Urobilinogen, UA: 0.2 E.U./dL
pH, UA: 5 (ref 5.0–8.0)

## 2023-04-09 NOTE — Assessment & Plan Note (Signed)
Normal exam with stable BP on Cartia bid. No concerns or side effects to current medication. No change in regimen; continue low sodium diet.

## 2023-04-09 NOTE — Assessment & Plan Note (Signed)
Level low last year - advised to take vitamin D daily

## 2023-04-09 NOTE — Assessment & Plan Note (Addendum)
Asthma controlled on PRN Symbicort and PRN albuterol

## 2023-04-09 NOTE — Assessment & Plan Note (Signed)
Lipids controlled with diet alone. Lab Results  Component Value Date   LDLCALC 119 (H) 03/21/2022

## 2023-04-09 NOTE — Patient Instructions (Addendum)
Take Vitamin D daily - 1000 international units    Schedule Skin exam

## 2023-04-09 NOTE — Progress Notes (Signed)
Date:  04/09/2023   Name:  Ronald Fry   DOB:  1961/03/16   MRN:  [id number]   Chief Complaint: Annual Exam Ronald Fry is a 62 y.o. male who presents today for his Complete Annual Exam. He feels well. He reports exercising a lot at work and walks and does yard work. He reports he is sleeping well. He has no complaints.  Colonoscopy: 10/2018 repeat 10 yrs  Immunization History  Administered Date(s) Administered   COVID-19, mRNA, vaccine(Comirnaty)12 years and older 06/28/2022   Influenza,inj,Quad PF,6+ Mos 08/07/2016, 07/12/2018, 05/29/2019   Influenza-Unspecified 05/25/2015, 05/28/2017, 07/12/2018   PFIZER(Purple Top)SARS-COV-2 Vaccination 11/23/2019, 12/17/2019, 06/28/2020, 01/14/2021   PNEUMOCOCCAL CONJUGATE-20 01/14/2021   Pneumococcal-Unspecified 08/28/2012   Respiratory Syncytial Virus Vaccine,Recomb Aduvanted(Arexvy) 06/28/2022   Tdap 08/28/2010, 08/29/2011, 01/14/2021   Zoster Recombinant(Shingrix) 12/10/2016, 03/11/2017   Zoster, Live 08/28/2013   Health Maintenance Due  Topic Date Due   INFLUENZA VACCINE  03/29/2023    Lab Results  Component Value Date   PSA1 3.3 03/21/2022   PSA1 2.9 12/14/2020   PSA1 2.8 03/18/2019    Hypertension This is a chronic problem. The problem is controlled. Pertinent negatives include no chest pain, headaches, palpitations or shortness of breath. Past treatments include calcium channel blockers. The current treatment provides significant improvement. There is no history of kidney disease, CAD/MI or CVA.  Asthma There is no shortness of breath or wheezing. This is a recurrent problem. Pertinent negatives include no appetite change, chest pain, headaches, myalgias or trouble swallowing. His symptoms are alleviated by steroid inhaler and beta-agonist. His past medical history is significant for asthma.    Lab Results  Component Value Date   NA 142 03/21/2022   K 4.6 03/21/2022   CO2 26 03/21/2022   GLUCOSE 109 (H) 03/21/2022    BUN 13 03/21/2022   CREATININE 1.02 03/21/2022   CALCIUM 9.3 03/21/2022   EGFR 83 03/21/2022   GFRNONAA 84 05/29/2019   Lab Results  Component Value Date   CHOL 190 03/21/2022   HDL 41 03/21/2022   LDLCALC 119 (H) 03/21/2022   TRIG 168 (H) 03/21/2022   CHOLHDL 4.6 03/21/2022   Lab Results  Component Value Date   TSH 1.758 11/08/2018   Lab Results  Component Value Date   HGBA1C 5.3 03/21/2022   Lab Results  Component Value Date   WBC 7.1 03/21/2022   HGB 16.5 03/21/2022   HCT 48.4 03/21/2022   MCV 90 03/21/2022   PLT 235 03/21/2022   Lab Results  Component Value Date   ALT 19 03/21/2022   AST 16 03/21/2022   ALKPHOS 71 03/21/2022   BILITOT 0.5 03/21/2022   Lab Results  Component Value Date   VD25OH 13.9 (L) 03/21/2022     Review of Systems  Constitutional:  Negative for appetite change, chills, diaphoresis, fatigue and unexpected weight change.  HENT:  Positive for hearing loss. Negative for tinnitus, trouble swallowing and voice change.   Eyes:  Negative for visual disturbance.  Respiratory:  Negative for choking, shortness of breath and wheezing.   Cardiovascular:  Negative for chest pain, palpitations and leg swelling.  Gastrointestinal:  Negative for abdominal pain, blood in stool, constipation and diarrhea.  Genitourinary:  Negative for difficulty urinating, dysuria and frequency.  Musculoskeletal:  Negative for arthralgias, back pain and myalgias.  Skin:  Negative for color change and rash.  Neurological:  Negative for dizziness, syncope and headaches.  Hematological:  Negative for adenopathy.  Psychiatric/Behavioral:  Negative for dysphoric  mood and sleep disturbance. The patient is not nervous/anxious.     Patient Active Problem List   Diagnosis Date Noted   OSA (obstructive sleep apnea) 03/17/2021   S/P ablation of atrial fibrillation 11/17/2018   Mild hyperlipidemia 10/07/2017   Environmental and seasonal allergies 08/07/2016    Hypotestosteronism 11/02/2015   Intermittent palpitations 07/27/2015   Gastroesophageal reflux disease 07/27/2015   Asthma, mild intermittent 07/27/2015   Vitamin D deficiency 07/27/2015   Essential hypertension 07/26/2015   Obesity, Class I, BMI 30-34.9 07/26/2015    No Known Allergies  Past Surgical History:  Procedure Laterality Date   CARDIAC ELECTROPHYSIOLOGY STUDY AND ABLATION  08/2019   COLONOSCOPY WITH PROPOFOL N/A 11/08/2018   Procedure: COLONOSCOPY WITH BIOPSY;  Surgeon: Midge Minium, MD;  Location: [facility];  Service: Endoscopy;  Laterality: N/A;   POLYPECTOMY N/A 11/08/2018   Procedure: POLYPECTOMY;  Surgeon: Midge Minium, MD;  Location: [facility];  Service: Endoscopy;  Laterality: N/A;   TREATMENT FISTULA ANAL      Social History   Tobacco Use   Smoking status: Never   Smokeless tobacco: Never  Vaping Use   Vaping status: Never Used  Substance Use Topics   Alcohol use: Yes    Alcohol/week: 1.0 standard drink of alcohol    Types: 1 Cans of beer per week   Drug use: No     Medication list has been reviewed and updated.  Current Meds  Medication Sig   albuterol (PROAIR HFA) 108 (90 Base) MCG/ACT inhaler Inhale 2 puffs into the lungs every 6 (six) hours as needed for wheezing or shortness of breath.   albuterol (PROVENTIL) (2.5 MG/3ML) 0.083% nebulizer solution Take 3 mLs (2.5 mg total) by nebulization every 6 (six) hours as needed for wheezing or shortness of breath.   budesonide-formoterol (SYMBICORT) 160-4.5 MCG/ACT inhaler Inhale 2 puffs into the lungs 2 (two) times daily.   CARTIA XT 120 MG 24 hr capsule TAKE 1 CAPSULE TWICE A DAY   etodolac (LODINE) 500 MG tablet Take 1 tablet (500 mg total) by mouth 2 (two) times daily.   levocetirizine (XYZAL) 2.5 MG/5ML solution Take 2.5 mg by mouth every evening.   tadalafil (CIALIS) 20 MG tablet Take 1 tablet (20 mg total) by mouth daily as needed for erectile dysfunction.   triamcinolone (NASACORT  ALLERGY 24HR) 55 MCG/ACT AERO nasal inhaler Place 2 sprays into the nose daily.   [DISCONTINUED] cetirizine (ZYRTEC) 10 MG tablet Take 10 mg by mouth daily.       04/09/2023    8:07 AM 10/13/2022    8:49 AM 10/06/2022    8:59 AM 03/21/2022    8:12 AM  GAD 7 : Generalized Anxiety Score  Nervous, Anxious, on Edge 0 0 0 0  Control/stop worrying 0 0 0 0  Worry too much - different things 0 0 0 0  Trouble relaxing 0 0 0 0  Restless 0 0 0 0  Easily annoyed or irritable 0 0 0 0  Afraid - awful might happen 0 0 0 0  Total GAD 7 Score 0 0 0 0  Anxiety Difficulty Not difficult at all Not difficult at all Not difficult at all Not difficult at all       04/09/2023    8:07 AM 10/13/2022    8:48 AM 10/06/2022    8:59 AM  Depression screen PHQ 2/9  Decreased Interest 0 0 0  Down, Depressed, Hopeless 0 0 0  PHQ - 2 Score 0  0 0  Altered sleeping 0 0 0  Tired, decreased energy 0 0 0  Change in appetite 0 0 0  Feeling bad or failure about yourself  0 0 0  Trouble concentrating 0 0 0  Moving slowly or fidgety/restless 0 0 0  Suicidal thoughts 0 0 0  PHQ-9 Score 0 0 0  Difficult doing work/chores Not difficult at all Not difficult at all Not difficult at all    BP Readings from Last 3 Encounters:  04/09/23 112/72  10/13/22 122/78  10/06/22 122/74    Physical Exam Vitals and nursing note reviewed.  Constitutional:      Appearance: Normal appearance. He is well-developed.  HENT:     Head: Normocephalic.     Right Ear: Tympanic membrane, ear canal and external ear normal.     Left Ear: Tympanic membrane, ear canal and external ear normal.     Nose: Nose normal.  Eyes:     Conjunctiva/sclera: Conjunctivae normal.     Pupils: Pupils are equal, round, and reactive to light.  Neck:     Thyroid: No thyromegaly.     Vascular: No carotid bruit.  Cardiovascular:     Rate and Rhythm: Normal rate and regular rhythm.     Heart sounds: Normal heart sounds.  Pulmonary:     Effort: Pulmonary  effort is normal.     Breath sounds: Normal breath sounds. No wheezing.  Chest:  Breasts:    Right: No mass.     Left: No mass.  Abdominal:     General: Bowel sounds are normal.     Palpations: Abdomen is soft.     Tenderness: There is no abdominal tenderness.  Musculoskeletal:        General: Normal range of motion.     Cervical back: Normal range of motion and neck supple.     Right lower leg: No edema.     Left lower leg: No edema.  Lymphadenopathy:     Cervical: No cervical adenopathy.  Skin:    General: Skin is warm and dry.  Neurological:     General: No focal deficit present.     Mental Status: He is alert and oriented to person, place, and time.     Deep Tendon Reflexes: Reflexes are normal and symmetric.  Psychiatric:        Attention and Perception: Attention normal.        Mood and Affect: Mood normal.        Thought Content: Thought content normal.     Wt Readings from Last 3 Encounters:  04/09/23 235 lb (106.6 kg)  10/13/22 241 lb (109.3 kg)  10/06/22 242 lb (109.8 kg)    BP 112/72   Pulse 76   Ht 5\' 9"  (1.753 m)   Wt 235 lb (106.6 kg)   SpO2 94%   BMI 34.70 kg/m   Assessment and Plan:  Problem List Items Addressed This Visit       Unprioritized   Vitamin D deficiency (Chronic)    Level low last year - advised to take vitamin D daily      Relevant Orders   VITAMIN D 25 Hydroxy (Vit-D Deficiency, Fractures)   Mild hyperlipidemia (Chronic)    Lipids controlled with diet alone. Lab Results  Component Value Date   LDLCALC 119 (H) 03/21/2022         Relevant Orders   Lipid panel   Essential hypertension (Chronic)    Normal exam with stable BP  on Cartia bid. No concerns or side effects to current medication. No change in regimen; continue low sodium diet.       Relevant Orders   CBC with Differential/Platelet   Comprehensive metabolic panel   TSH   POCT urinalysis dipstick   Asthma, mild intermittent (Chronic)    Asthma controlled  on PRN Symbicort and PRN albuterol      Other Visit Diagnoses     Annual physical exam    -  Primary   Relevant Orders   CBC with Differential/Platelet   Comprehensive metabolic panel   Lipid panel   TSH   PSA   VITAMIN D 25 Hydroxy (Vit-D Deficiency, Fractures)   Prostate cancer screening       Relevant Orders   PSA       Return in about 6 months (around 10/10/2023) for HTN.    Reubin Milan, MD [facility] [place]

## 2023-04-10 ENCOUNTER — Other Ambulatory Visit: Payer: Self-pay

## 2023-04-10 MED ORDER — VITAMIN D (ERGOCALCIFEROL) 1.25 MG (50000 UNIT) PO CAPS: 50000.0000 [IU] | ORAL_CAPSULE | ORAL | 3 refills | Status: DC

## 2023-10-10 ENCOUNTER — Ambulatory Visit: Payer: Self-pay | Admitting: Internal Medicine

## 2023-11-15 ENCOUNTER — Ambulatory Visit: Admitting: Internal Medicine

## 2023-11-15 ENCOUNTER — Encounter: Payer: Self-pay | Admitting: Internal Medicine

## 2023-11-15 VITALS — BP 112/74 | HR 83 | Temp 97.8°F | Ht 69.0 in | Wt 238.4 lb

## 2023-11-15 DIAGNOSIS — H938X1 Other specified disorders of right ear: Secondary | ICD-10-CM

## 2023-11-15 NOTE — Progress Notes (Signed)
 Date:  11/15/2023   Name:  Ronald Fry   DOB:  26-Apr-1961   MRN:  [id number]   Chief Complaint: Ear Pain (Patient said he started with RIGHT ear pain last night, no fever, no drainage )  Otalgia  There is pain in the right ear. This is a new problem. The current episode started yesterday. The problem occurs constantly. The problem has been unchanged. There has been no fever. Associated symptoms include rhinorrhea. Pertinent negatives include no coughing, diarrhea, ear discharge, neck pain, sore throat or vomiting. Treatments tried: he used Zyrtec and Flonase last night.    Review of Systems  Constitutional:  Negative for appetite change, chills, fatigue and fever.  HENT:  Positive for congestion, ear pain, postnasal drip and rhinorrhea. Negative for ear discharge and sore throat.   Respiratory:  Negative for cough, chest tightness and shortness of breath.   Cardiovascular:  Negative for chest pain.  Gastrointestinal:  Negative for diarrhea and vomiting.  Musculoskeletal:  Negative for neck pain.     Lab Results  Component Value Date   NA 141 04/09/2023   K 4.5 04/09/2023   CO2 22 04/09/2023   GLUCOSE 111 (H) 04/09/2023   BUN 13 04/09/2023   CREATININE 1.04 04/09/2023   CALCIUM 9.2 04/09/2023   EGFR 81 04/09/2023   GFRNONAA 84 05/29/2019   Lab Results  Component Value Date   CHOL 185 04/09/2023   HDL 40 04/09/2023   LDLCALC 120 (H) 04/09/2023   TRIG 139 04/09/2023   CHOLHDL 4.6 04/09/2023   Lab Results  Component Value Date   TSH 3.440 04/09/2023   Lab Results  Component Value Date   HGBA1C 5.3 03/21/2022   Lab Results  Component Value Date   WBC 7.7 04/09/2023   HGB 16.7 04/09/2023   HCT 48.1 04/09/2023   MCV 90 04/09/2023   PLT 233 04/09/2023   Lab Results  Component Value Date   ALT 16 04/09/2023   AST 11 04/09/2023   ALKPHOS 78 04/09/2023   BILITOT 0.4 04/09/2023   Lab Results  Component Value Date   VD25OH 12.8 (L) 04/09/2023     Patient  Active Problem List   Diagnosis Date Noted   OSA (obstructive sleep apnea) 03/17/2021   S/P ablation of atrial fibrillation 11/17/2018   Mild hyperlipidemia 10/07/2017   Environmental and seasonal allergies 08/07/2016   Hypotestosteronism 11/02/2015   Intermittent palpitations 07/27/2015   Gastroesophageal reflux disease 07/27/2015   Asthma, mild intermittent 07/27/2015   Vitamin D deficiency 07/27/2015   Essential hypertension 07/26/2015   Obesity, Class I, BMI 30-34.9 07/26/2015    No Known Allergies  Past Surgical History:  Procedure Laterality Date   CARDIAC ELECTROPHYSIOLOGY STUDY AND ABLATION  08/2019   COLONOSCOPY WITH PROPOFOL N/A 11/08/2018   Procedure: COLONOSCOPY WITH BIOPSY;  Surgeon: Midge Minium, MD;  Location: [facility];  Service: Endoscopy;  Laterality: N/A;   POLYPECTOMY N/A 11/08/2018   Procedure: POLYPECTOMY;  Surgeon: Midge Minium, MD;  Location: [facility];  Service: Endoscopy;  Laterality: N/A;   TREATMENT FISTULA ANAL      Social History   Tobacco Use   Smoking status: Never   Smokeless tobacco: Never  Vaping Use   Vaping status: Never Used  Substance Use Topics   Alcohol use: Yes    Alcohol/week: 1.0 standard drink of alcohol    Types: 1 Cans of beer per week   Drug use: No     Medication list has been  reviewed and updated.  Current Meds  Medication Sig   albuterol (PROAIR HFA) 108 (90 Base) MCG/ACT inhaler Inhale 2 puffs into the lungs every 6 (six) hours as needed for wheezing or shortness of breath.   albuterol (PROVENTIL) (2.5 MG/3ML) 0.083% nebulizer solution Take 3 mLs (2.5 mg total) by nebulization every 6 (six) hours as needed for wheezing or shortness of breath.   budesonide-formoterol (SYMBICORT) 160-4.5 MCG/ACT inhaler Inhale 2 puffs into the lungs 2 (two) times daily.   CARTIA XT 120 MG 24 hr capsule TAKE 1 CAPSULE TWICE A DAY   etodolac (LODINE) 500 MG tablet Take 1 tablet (500 mg total) by mouth 2 (two) times  daily.   levocetirizine (XYZAL) 2.5 MG/5ML solution Take 2.5 mg by mouth every evening.   tadalafil (CIALIS) 20 MG tablet Take 1 tablet (20 mg total) by mouth daily as needed for erectile dysfunction.   triamcinolone (NASACORT ALLERGY 24HR) 55 MCG/ACT AERO nasal inhaler Place 2 sprays into the nose daily.   Vitamin D, Ergocalciferol, (DRISDOL) 1.25 MG (50000 UNIT) CAPS capsule Take 1 capsule (50,000 Units total) by mouth every 7 (seven) days.       11/15/2023    9:01 AM 04/09/2023    8:07 AM 10/13/2022    8:49 AM 10/06/2022    8:59 AM  GAD 7 : Generalized Anxiety Score  Nervous, Anxious, on Edge 0 0 0 0  Control/stop worrying 0 0 0 0  Worry too much - different things 0 0 0 0  Trouble relaxing 0 0 0 0  Restless 0 0 0 0  Easily annoyed or irritable 0 0 0 0  Afraid - awful might happen 0 0 0 0  Total GAD 7 Score 0 0 0 0  Anxiety Difficulty Not difficult at all Not difficult at all Not difficult at all Not difficult at all       11/15/2023    9:01 AM 04/09/2023    8:07 AM 10/13/2022    8:48 AM  Depression screen PHQ 2/9  Decreased Interest 0 0 0  Down, Depressed, Hopeless 0 0 0  PHQ - 2 Score 0 0 0  Altered sleeping 0 0 0  Tired, decreased energy 0 0 0  Change in appetite 0 0 0  Feeling bad or failure about yourself  0 0 0  Trouble concentrating 0 0 0  Moving slowly or fidgety/restless 0 0 0  Suicidal thoughts 0 0 0  PHQ-9 Score 0 0 0  Difficult doing work/chores Not difficult at all Not difficult at all Not difficult at all    BP Readings from Last 3 Encounters:  11/15/23 112/74  04/09/23 112/72  10/13/22 122/78    Physical Exam Vitals and nursing note reviewed.  Constitutional:      General: He is not in acute distress.    Appearance: He is well-developed.     Comments: After cerumen flushing  HENT:     Head: Normocephalic and atraumatic.     Comments: Excessive cerumen in right ear - flushed.  Patient felt nauseated after but did not vomit.  TM retracted but not  red, no fluid seen.  He was allowed to rest for 10 minutes with resolution of nausea.    Right Ear: Tympanic membrane normal.     Left Ear: There is impacted cerumen.  Cardiovascular:     Rate and Rhythm: Normal rate and regular rhythm.     Heart sounds: No murmur heard. Pulmonary:     Effort:  Pulmonary effort is normal. No respiratory distress.     Breath sounds: No wheezing or rhonchi.  Musculoskeletal:     Cervical back: Normal range of motion.  Lymphadenopathy:     Cervical: No cervical adenopathy.  Skin:    General: Skin is warm and dry.     Findings: No rash.  Neurological:     Mental Status: He is alert and oriented to person, place, and time.  Psychiatric:        Mood and Affect: Mood normal.        Behavior: Behavior normal.     Wt Readings from Last 3 Encounters:  11/15/23 238 lb 6 oz (108.1 kg)  04/09/23 235 lb (106.6 kg)  10/13/22 241 lb (109.3 kg)    BP 112/74   Pulse 83   Temp 97.8 F (36.6 C)   Ht 5\' 9"  (1.753 m)   Wt 238 lb 6 oz (108.1 kg)   SpO2 96%   BMI 35.20 kg/m   Assessment and Plan:  Problem List Items Addressed This Visit   None Visit Diagnoses       Congestion of right ear    -  Primary   continue allergy medication and nasal spray no indication for antibiotic at this time       No follow-ups on file.    Reubin Milan, MD [facility] [place]

## 2023-11-16 ENCOUNTER — Encounter: Payer: Self-pay | Admitting: Internal Medicine

## 2023-12-26 ENCOUNTER — Ambulatory Visit: Payer: Self-pay

## 2023-12-26 ENCOUNTER — Ambulatory Visit: Admitting: Urology

## 2023-12-26 VITALS — BP 157/83 | HR 82 | Ht 69.0 in | Wt 236.0 lb

## 2023-12-26 DIAGNOSIS — N529 Male erectile dysfunction, unspecified: Secondary | ICD-10-CM | POA: Diagnosis not present

## 2023-12-26 DIAGNOSIS — Z125 Encounter for screening for malignant neoplasm of prostate: Secondary | ICD-10-CM | POA: Diagnosis not present

## 2023-12-26 DIAGNOSIS — R351 Nocturia: Secondary | ICD-10-CM | POA: Diagnosis not present

## 2023-12-26 MED ORDER — TADALAFIL 20 MG PO TABS: 10.0000 mg | ORAL_TABLET | Freq: Every day | ORAL | 3 refills | Status: AC | PRN

## 2023-12-26 NOTE — Telephone Encounter (Signed)
 Noted  Pt has a appt.  KP

## 2023-12-26 NOTE — Telephone Encounter (Signed)
 Copied from CRM [id number]. Topic: Clinical - Red Word Triage >> Dec 26, 2023 12:39 PM Stanly Early wrote: Red Word that prompted transfer to Nurse Triage: should pain on left side    Chief Complaint: Left shoulder pain Symptoms: Above Frequency: 2 months, getting worse Pertinent Negatives: Patient denies injury Disposition: [] ED /[] Urgent Care (no appt availability in office) / [x] Appointment(In office/virtual)/ []  [place] [facility]/ [] Home Care/ [] Refused Recommended Disposition /[] [place] [facility]/ []  Follow-up with PCP Additional Notes: Agrees with appointment.  Reason for Disposition  [1] MODERATE pain (e.g., interferes with normal activities) AND [2] present > 3 days  Answer Assessment - Initial Assessment Questions 1. ONSET: "When did the pain start?"     2 months 2. LOCATION: "Where is the pain located?"     Left 3. PAIN: "How bad is the pain?" (Scale 1-10; or mild, moderate, severe)   - MILD (1-3): doesn't interfere with normal activities   - MODERATE (4-7): interferes with normal activities (e.g., work or school) or awakens from sleep   - SEVERE (8-10): excruciating pain, unable to do any normal activities, unable to move arm at all due to pain     Now - 4 4. WORK OR EXERCISE: "Has there been any recent work or exercise that involved this part of the body?"     no 5. CAUSE: "What do you think is causing the shoulder pain?"     unsure 6. OTHER SYMPTOMS: "Do you have any other symptoms?" (e.g., neck pain, swelling, rash, fever, numbness, weakness)     no 7. PREGNANCY: "Is there any chance you are pregnant?" "When was your last menstrual period?"     N/a  Protocols used: Shoulder Pain-A-AH

## 2023-12-26 NOTE — Progress Notes (Signed)
   12/26/2023 2:17 PM   Ronald Fry 11-Aug-1961 [id number]  Reason for visit: Follow up ED, BPH, nocturia, PSA screening  HPI: 63 year old male I last saw in April 2022 for the above issues.  He denies any major changes since that time.  In terms of ED, currently using Cialis  10 to 20 mg on demand with good results.  This was refilled.  Regarding his urinary symptoms, minimal complaints during the day, but has nocturia 2-5x overnight.  This varies based on his fluid intake in the evenings.  He also has diagnosed sleep apnea but noncompliant with CPAP machine.  Most recent PSA from August 2024 normal and stable at 3.3 from approximately 3-4 over the last 5 years.  Reassurance provided, can continue screening every 2 years through age 5.  Cialis  refilled Nocturia strategies discussed, encouraged to reconsider CPAP RTC 1 year PVR  Ronald Pressman, MD  [facility] [address] [place], [place] [address] [phone]

## 2023-12-26 NOTE — Patient Instructions (Addendum)
 Nocturia refers to the need to wake up during the night to urinate, which can disrupt your sleep and impact your overall well-being. Fortunately, there are several strategies you can employ to help prevent or manage nocturia. It's important to consult with your healthcare provider before making any significant changes to your routine. Here are some helpful strategies to consider:  Sleep apnea is a very common cause of sleep apnea, and often improved significantly with use of CPAP machine  Limit Fluid Intake Before Bed: Avoid drinking large amounts of fluids in the evening, especially within a few hours of bedtime. Consume most of your daily fluid intake earlier in the day to reduce the need to urinate at night.  Monitor Your Diet: Limit your intake of caffeine and alcohol, as these substances can increase urine production and irritate the bladder.  Avoid diet, "zero calorie," and artificially sweetened drinks, especially sodas, in the afternoon or evening. Be mindful of consuming foods and drinks with high water  content before bedtime, such as watermelon and herbal teas.  Time Your Medications: If you're taking medications that contribute to increased urination, consult your healthcare provider about adjusting the timing of these medications to minimize their impact during the night.  Practice Double Voiding: Before going to bed, make an effort to empty your bladder twice within a short period. This can help reduce the amount of urine left in your bladder before sleep.  Bladder Training: Gradually increase the time between bathroom visits during the day to train your bladder to hold larger volumes of urine. Over time, this can help reduce the frequency of nighttime awakenings to urinate.  Elevate Your Legs During the Day: Elevating your legs during the day can help minimize fluid retention in your lower extremities, which might reduce nighttime urination.  Pelvic Floor  Exercises: Strengthening your pelvic floor muscles through Kegel exercises can help improve bladder control and potentially reduce the urge to urinate at night.  Create a Relaxing Bedtime Routine: Stress and anxiety can exacerbate nocturia. Engage in calming activities before bed, such as reading, listening to soothing music, or practicing relaxation techniques.  Stay Active: Engage in regular physical activity, but avoid intense exercise close to bedtime, as this can increase your body's demand for fluids.  Maintain a Healthy Weight: Excess weight can compress the bladder and contribute to bladder and urinary issues. Aim to achieve and maintain a healthy weight through a balanced diet and regular exercise.  Remember that every individual is unique, and the effectiveness of these strategies may vary. It's important to work with your healthcare provider to develop a plan that suits your specific needs and addresses any underlying causes of nocturia.

## 2023-12-27 ENCOUNTER — Encounter: Payer: Self-pay | Admitting: Family Medicine

## 2023-12-27 ENCOUNTER — Ambulatory Visit: Admitting: Family Medicine

## 2023-12-27 VITALS — BP 128/78 | HR 74 | Ht 69.0 in | Wt 238.0 lb

## 2023-12-27 DIAGNOSIS — L03116 Cellulitis of left lower limb: Secondary | ICD-10-CM | POA: Insufficient documentation

## 2023-12-27 DIAGNOSIS — M7542 Impingement syndrome of left shoulder: Secondary | ICD-10-CM | POA: Diagnosis not present

## 2023-12-27 MED ORDER — MELOXICAM 15 MG PO TABS: 15.0000 mg | ORAL_TABLET | Freq: Every day | ORAL | 0 refills | Status: DC

## 2023-12-27 MED ORDER — CEPHALEXIN 500 MG PO CAPS: 500.0000 mg | ORAL_CAPSULE | Freq: Four times a day (QID) | ORAL | 0 refills | Status: AC

## 2023-12-27 NOTE — Assessment & Plan Note (Signed)
 History of Present Illness Ronald Fry "Ronald Fry" is a 63 year old right-hand-dominant male who presents with left shoulder pain.  He has experienced left shoulder pain for at least two months without a specific inciting event. The pain worsens with overhead activities, such as scanning items at work, and he has difficulty raising his arm beyond a certain level. Pain intensity reaches five out of ten after moving boxes. The pain is localized to the top back part of the shoulder and does not radiate to the neck or elbow. Intermittent pain occurs during activities like eating dinner, with increased noticeability in the past couple of weeks. There is no night pain when sleeping on his right side.  He works part-time at [employer], involving lifting, and engages in outdoor activities [place] his [place]. He is right-handed and a side sleeper, using pillows to avoid shoulder pressure.  For pain management, he occasionally takes Aleve, usually one tablet when the pain is bothersome, but is unsure of its effectiveness. He has not used ice, heat, or other medications.  Physical Exam PALPATION: Nontender at the subacromial bicipital groove and left upper trapezius. RANGE OF MOTION: Forward flexion with 10-15 degrees deficit compared to contralateral right. Full and symmetric abduction, left with pain at maximal abduction. Full symmetric external rotation. STRENGTH: External rotation with 5/5 strength, symmetric. 5/5 strength with internal rotation bilaterally, pain on the left. 5/5 strength with isolated supraspinatus testing bilaterally. SPECIAL TESTS: Negative Neer test on the left. Positive Hawkins test on the left. Negative speeds and Yergason's test on the left.  Assessment and Plan Left shoulder impingement syndrome Chronic left shoulder pain due to rotator cuff tendinopathy with impingement, involving subscapularis and supraspinatus muscles. No full-thickness tear. - Prescribe  30-day anti-inflammatory medication, meloxicam , to be dosed daily for two weeks, then as needed. - Advise taking medication with food. - Instruct to avoid overhead activities and modify lifting techniques. - Provide home-based rehabilitation exercises via MyChart.  Start these exercises next week if symptoms permit. - Consider corticosteroid injection if no improvement after two weeks. - Instruct to message if symptoms persist or worsen.

## 2023-12-27 NOTE — Patient Instructions (Signed)
 Patient Plan  Cellulitis of Left Leg: 1. Take Keflex  (cephALEXin ) 500 mg capsule, four times daily for 5 days. 2. Use hot soaks to help with drainage. 3. Apply gauze and medical tape if drainage occurs.  4. Avoid picking at the infected area. 5. Contact us  if symptoms persist after completing antibiotics.  Left Shoulder Impingement Syndrome: 1. Take meloxicam  (MOBIC ) 15 mg tablet daily for two weeks, then as needed for pain. Take with food. 2. Avoid overhead activities and modify lifting techniques to reduce strain. 3. Begin home-based rehabilitation exercises provided via MyChart next week, if symptoms allow. 4. Consider a corticosteroid injection if there is no improvement after two weeks. 5. Contact us  if symptoms persist or worsen.  Red Flags: - Contact your healthcare provider if you notice increased swelling, redness, or pain in the leg or shoulder, or if you experience any new symptoms.

## 2023-12-27 NOTE — Progress Notes (Signed)
 Primary Care / Sports Medicine Office Visit  Patient Information:  Patient ID: Ronald Fry, male DOB: [birth year] Age: 63 y.o. MRN: [id number]   Ronald Fry is a pleasant 63 y.o. male presenting with the following:  Chief Complaint  Patient presents with   Shoulder Pain    Left shoulder pain x 2 months. Pain has gradually got worse but yesterday was the worst. NKI. Difficulty with lifting, ROM, and lying on the left side. He has taken Aleve for the pain. No PT or injection therapy in the past.    Vitals:   12/27/23 1358  BP: 128/78  Pulse: 74  SpO2: 95%   Vitals:   12/27/23 1358  Weight: 238 lb (108 kg)  Height: 5\' 9"  (1.753 m)   Body mass index is 35.15 kg/m.  No results found.   Independent interpretation of notes and tests performed by another provider:   None  Procedures performed:   None  Pertinent History, Exam, Impression, and Recommendations:   Problem List Items Addressed This Visit     Cellulitis of left leg   Cellulitis following insect bite Localized infection behind the knee with swelling and induration. - Prescribe oral antibiotics.  Keflex , 4 times daily x 5 days. - Advise hot soaks to facilitate drainage. - Instruct to apply gauze and medical tape if drainage occurs. - Advise to avoid picking at the area. - Instruct to message if symptoms persist after antibiotics.      Relevant Medications   cephALEXin  (KEFLEX ) 500 MG capsule   Rotator cuff impingement syndrome, left - Primary   History of Present Illness Ronald Fry "Ronald Fry" is a 63 year old right-hand-dominant male who presents with left shoulder pain.  He has experienced left shoulder pain for at least two months without a specific inciting event. The pain worsens with overhead activities, such as scanning items at work, and he has difficulty raising his arm beyond a certain level. Pain intensity reaches five out of ten after moving boxes. The pain is localized to the top  back part of the shoulder and does not radiate to the neck or elbow. Intermittent pain occurs during activities like eating dinner, with increased noticeability in the past couple of weeks. There is no night pain when sleeping on his right side.  He works part-time at [employer], involving lifting, and engages in outdoor activities on his [place] property. He is right-handed and a side sleeper, using pillows to avoid shoulder pressure.  For pain management, he occasionally takes Aleve, usually one tablet when the pain is bothersome, but is unsure of its effectiveness. He has not used ice, heat, or other medications.  Physical Exam PALPATION: Nontender at the subacromial bicipital groove and left upper trapezius. RANGE OF MOTION: Forward flexion with 10-15 degrees deficit compared to contralateral right. Full and symmetric abduction, left with pain at maximal abduction. Full symmetric external rotation. STRENGTH: External rotation with 5/5 strength, symmetric. 5/5 strength with internal rotation bilaterally, pain on the left. 5/5 strength with isolated supraspinatus testing bilaterally. SPECIAL TESTS: Negative Neer test on the left. Positive Hawkins test on the left. Negative speeds and Yergason's test on the left.  Assessment and Plan Left shoulder impingement syndrome Chronic left shoulder pain due to rotator cuff tendinopathy with impingement, involving subscapularis and supraspinatus muscles. No full-thickness tear. - Prescribe 30-day anti-inflammatory medication, meloxicam , to be dosed daily for two weeks, then as needed. - Advise taking medication with food. - Instruct to avoid overhead activities  and modify lifting techniques. - Provide home-based rehabilitation exercises via MyChart.  Start these exercises next week if symptoms permit. - Consider corticosteroid injection if no improvement after two weeks. - Instruct to message if symptoms persist or worsen.       Relevant Medications   meloxicam  (MOBIC ) 15 MG tablet   I provided a total time of 35 minutes including both face-to-face and non-face-to-face time on 12/27/2023 inclusive of time utilized for medical chart review, information gathering, care coordination with staff, and documentation completion.   Orders & Medications Medications:  Meds ordered this encounter  Medications   meloxicam  (MOBIC ) 15 MG tablet    Sig: Take 1 tablet (15 mg total) by mouth daily. X 2 weeks then daily as needed.  Take with food.    Dispense:  30 tablet    Refill:  0   cephALEXin  (KEFLEX ) 500 MG capsule    Sig: Take 1 capsule (500 mg total) by mouth every 6 (six) hours for 5 days.    Dispense:  20 capsule    Refill:  0   No orders of the defined types were placed in this encounter.    No follow-ups on file.     Ma Saupe, MD, [facility]   Primary Care Sports Medicine Primary Care and Sports Medicine at [facility]

## 2023-12-27 NOTE — Assessment & Plan Note (Signed)
 Cellulitis following insect bite Localized infection behind the knee with swelling and induration. - Prescribe oral antibiotics.  Keflex , 4 times daily x 5 days. - Advise hot soaks to facilitate drainage. - Instruct to apply gauze and medical tape if drainage occurs. - Advise to avoid picking at the area. - Instruct to message if symptoms persist after antibiotics.

## 2024-01-24 ENCOUNTER — Other Ambulatory Visit: Payer: Self-pay | Admitting: Family Medicine

## 2024-01-24 DIAGNOSIS — M7542 Impingement syndrome of left shoulder: Secondary | ICD-10-CM

## 2024-01-25 NOTE — Telephone Encounter (Signed)
 Requested Prescriptions  Pending Prescriptions Disp Refills   meloxicam  (MOBIC ) 15 MG tablet [Pharmacy Med Name: MELOXICAM  15MG  TABLETS] 30 tablet 2    Sig: TAKE 1 TABLET BY MOUTH DAILY FOR 2 WEEKS     Analgesics:  COX2 Inhibitors Failed - 01/25/2024  4:29 PM      Failed - Manual Review: Labs are only required if the patient has taken medication for more than 8 weeks.      Failed - Valid encounter within last 12 months    Recent Outpatient Visits           4 weeks ago Rotator cuff impingement syndrome, left   [place] [facility], Dessie Flow, MD   2 months ago Congestion of right ear   [place] [facility] at [facility], Chales [place], MD       Future Appointments             In 2 months Gala Jubilee Chales [place], MD [facility] Primary Care & Sports Medicine at [facility], [facility]   In 11 months Lawerence Pressman, MD [facility] [place]            Passed - HGB in normal range and within 360 days    Hemoglobin  Date Value Ref Range Status  04/09/2023 16.7 13.0 - 17.7 g/dL Final         Passed - Cr in normal range and within 360 days    Creatinine, Ser  Date Value Ref Range Status  04/09/2023 1.04 0.76 - 1.27 mg/dL Final         Passed - HCT in normal range and within 360 days    Hematocrit  Date Value Ref Range Status  04/09/2023 48.1 37.5 - 51.0 % Final         Passed - AST in normal range and within 360 days    AST  Date Value Ref Range Status  04/09/2023 11 0 - 40 IU/L Final         Passed - ALT in normal range and within 360 days    ALT  Date Value Ref Range Status  04/09/2023 16 0 - 44 IU/L Final         Passed - eGFR is 30 or above and within 360 days    GFR calc Af Amer  Date Value Ref Range Status  05/29/2019 97 >59 mL/min/1.73 Final   GFR calc non Af Amer  Date Value Ref Range Status  05/29/2019 84 >59 mL/min/1.73 Final   eGFR  Date Value Ref Range Status   04/09/2023 81 >59 mL/min/1.73 Final         Passed - Patient is not pregnant

## 2024-02-11 ENCOUNTER — Encounter: Payer: Self-pay | Admitting: Family Medicine

## 2024-02-11 ENCOUNTER — Encounter: Payer: Self-pay | Admitting: Internal Medicine

## 2024-02-11 ENCOUNTER — Ambulatory Visit: Admitting: Internal Medicine

## 2024-02-11 VITALS — BP 128/76 | HR 79 | Temp 98.2°F | Ht 69.0 in | Wt 239.0 lb

## 2024-02-11 DIAGNOSIS — J019 Acute sinusitis, unspecified: Secondary | ICD-10-CM | POA: Diagnosis not present

## 2024-02-11 DIAGNOSIS — J4520 Mild intermittent asthma, uncomplicated: Secondary | ICD-10-CM

## 2024-02-11 DIAGNOSIS — I10 Essential (primary) hypertension: Secondary | ICD-10-CM | POA: Diagnosis not present

## 2024-02-11 DIAGNOSIS — J3089 Other allergic rhinitis: Secondary | ICD-10-CM | POA: Diagnosis not present

## 2024-02-11 DIAGNOSIS — D0339 Melanoma in situ of other parts of face: Secondary | ICD-10-CM | POA: Insufficient documentation

## 2024-02-11 MED ORDER — PREDNISONE 10 MG PO TABS: ORAL_TABLET | ORAL | 0 refills | Status: AC

## 2024-02-11 MED ORDER — ALBUTEROL SULFATE HFA 108 (90 BASE) MCG/ACT IN AERS: 2.0000 | INHALATION_SPRAY | Freq: Four times a day (QID) | RESPIRATORY_TRACT | 5 refills | Status: AC | PRN

## 2024-02-11 MED ORDER — BUDESONIDE-FORMOTEROL FUMARATE 160-4.5 MCG/ACT IN AERO: 2.0000 | INHALATION_SPRAY | Freq: Two times a day (BID) | RESPIRATORY_TRACT | 5 refills | Status: AC

## 2024-02-11 MED ORDER — AZITHROMYCIN 250 MG PO TABS: ORAL_TABLET | ORAL | 0 refills | Status: AC

## 2024-02-11 MED ORDER — ALBUTEROL SULFATE (2.5 MG/3ML) 0.083% IN NEBU: 2.5000 mg | INHALATION_SOLUTION | Freq: Four times a day (QID) | RESPIRATORY_TRACT | 1 refills | Status: AC | PRN

## 2024-02-11 NOTE — Assessment & Plan Note (Signed)
 Blood pressure is well controlled.  Current medications are Cardiazem bid. Will continue same regimen along with efforts to limit dietary sodium.

## 2024-02-11 NOTE — Assessment & Plan Note (Signed)
 Right side of face - treated with MOHS

## 2024-02-11 NOTE — Assessment & Plan Note (Signed)
 Worsening symptoms in setting of URI/sinusitis/allergies Will resume symbicort  bid Continue albuterol  MDI or nebs prn

## 2024-02-11 NOTE — Assessment & Plan Note (Signed)
 Continue Xyzal daily

## 2024-02-11 NOTE — Progress Notes (Signed)
 Date:  02/11/2024   Name:  Ronald Fry   DOB:  Oct 19, 1960   MRN:  [id number]   Chief Complaint: Hypertension and Sinus Problem (Pt is having sinus congestion, chest congestion, wheezing, X4 weeks. )  Hypertension This is a chronic problem. The problem is controlled. Associated symptoms include shortness of breath. Pertinent negatives include no chest pain, headaches or palpitations. Past treatments include calcium  channel blockers. The current treatment provides significant improvement. There is no history of kidney disease, CAD/MI or CVA.  Sinus Problem This is a recurrent problem. The current episode started more than 1 month ago. The problem is unchanged. There has been no fever. The pain is mild. Associated symptoms include congestion, coughing, shortness of breath and sinus pressure. Pertinent negatives include no chills, ear pain, headaches, hoarse voice, sore throat or swollen glands. Treatments tried: albuterol . The treatment provided mild relief.    Review of Systems  Constitutional:  Negative for chills, fatigue and fever.  HENT:  Positive for congestion, hearing loss and sinus pressure. Negative for ear pain, hoarse voice and sore throat.   Respiratory:  Positive for cough, chest tightness, shortness of breath and wheezing.   Cardiovascular:  Negative for chest pain and palpitations.  Neurological:  Negative for dizziness, light-headedness and headaches.  Psychiatric/Behavioral:  Negative for hallucinations and sleep disturbance. The patient is not nervous/anxious.      Lab Results  Component Value Date   NA 141 04/09/2023   K 4.5 04/09/2023   CO2 22 04/09/2023   GLUCOSE 111 (H) 04/09/2023   BUN 13 04/09/2023   CREATININE 1.04 04/09/2023   CALCIUM  9.2 04/09/2023   EGFR 81 04/09/2023   GFRNONAA 84 05/29/2019   Lab Results  Component Value Date   CHOL 185 04/09/2023   HDL 40 04/09/2023   LDLCALC 120 (H) 04/09/2023   TRIG 139 04/09/2023   CHOLHDL 4.6 04/09/2023    Lab Results  Component Value Date   TSH 3.440 04/09/2023   Lab Results  Component Value Date   HGBA1C 5.3 03/21/2022   Lab Results  Component Value Date   WBC 7.7 04/09/2023   HGB 16.7 04/09/2023   HCT 48.1 04/09/2023   MCV 90 04/09/2023   PLT 233 04/09/2023   Lab Results  Component Value Date   ALT 16 04/09/2023   AST 11 04/09/2023   ALKPHOS 78 04/09/2023   BILITOT 0.4 04/09/2023   Lab Results  Component Value Date   VD25OH 12.8 (L) 04/09/2023     Patient Active Problem List   Diagnosis Date Noted   Melanoma in situ of cheek (HCC) 02/11/2024   Rotator cuff impingement syndrome, left 12/27/2023   Cellulitis of left leg 12/27/2023   OSA (obstructive sleep apnea) 03/17/2021   S/P ablation of atrial fibrillation 11/17/2018   Mild hyperlipidemia 10/07/2017   Environmental and seasonal allergies 08/07/2016   Hypotestosteronism 11/02/2015   Intermittent palpitations 07/27/2015   Gastroesophageal reflux disease 07/27/2015   Asthma, mild intermittent 07/27/2015   Vitamin D  deficiency 07/27/2015   Essential hypertension 07/26/2015   Obesity, Class I, BMI 30-34.9 07/26/2015    No Known Allergies  Past Surgical History:  Procedure Laterality Date   CARDIAC ELECTROPHYSIOLOGY STUDY AND ABLATION  08/2019   COLONOSCOPY WITH PROPOFOL  N/A 11/08/2018   Procedure: COLONOSCOPY WITH BIOPSY;  Surgeon: Marnee Sink, MD;  Location: [facility];  Service: Endoscopy;  Laterality: N/A;   MELANOMA EXCISION     Eyebrow   POLYPECTOMY N/A 11/08/2018   Procedure:  POLYPECTOMY;  Surgeon: Marnee Sink, MD;  Location: [facility];  Service: Endoscopy;  Laterality: N/A;   TREATMENT FISTULA ANAL      Social History   Tobacco Use   Smoking status: Never   Smokeless tobacco: Never  Vaping Use   Vaping status: Never Used  Substance Use Topics   Alcohol use: Yes    Alcohol/week: 1.0 standard drink of alcohol    Types: 1 Cans of beer per week   Drug use: No      Medication list has been reviewed and updated.  Current Meds  Medication Sig   azithromycin  (ZITHROMAX  Z-PAK) 250 MG tablet UAD   CARTIA  XT 120 MG 24 hr capsule TAKE 1 CAPSULE TWICE A DAY   levocetirizine (XYZAL) 2.5 MG/5ML solution Take 2.5 mg by mouth every evening.   meloxicam  (MOBIC ) 15 MG tablet TAKE 1 TABLET BY MOUTH DAILY FOR 2 WEEKS   predniSONE  (DELTASONE ) 10 MG tablet Take 6 tablets (60 mg total) by mouth daily with breakfast for 1 day, THEN 5 tablets (50 mg total) daily with breakfast for 1 day, THEN 4 tablets (40 mg total) daily with breakfast for 1 day, THEN 3 tablets (30 mg total) daily with breakfast for 1 day, THEN 2 tablets (20 mg total) daily with breakfast for 1 day, THEN 1 tablet (10 mg total) daily with breakfast for 1 day.   tadalafil  (CIALIS ) 20 MG tablet Take 0.5-1 tablets (10-20 mg total) by mouth daily as needed for erectile dysfunction.   triamcinolone (NASACORT ALLERGY 24HR) 55 MCG/ACT AERO nasal inhaler Place 2 sprays into the nose daily.   Vitamin D , Ergocalciferol , (DRISDOL ) 1.25 MG (50000 UNIT) CAPS capsule Take 1 capsule (50,000 Units total) by mouth every 7 (seven) days.   [DISCONTINUED] albuterol  (PROAIR  HFA) 108 (90 Base) MCG/ACT inhaler Inhale 2 puffs into the lungs every 6 (six) hours as needed for wheezing or shortness of breath.   [DISCONTINUED] albuterol  (PROVENTIL ) (2.5 MG/3ML) 0.083% nebulizer solution Take 3 mLs (2.5 mg total) by nebulization every 6 (six) hours as needed for wheezing or shortness of breath.       02/11/2024   11:22 AM 11/15/2023    9:01 AM 04/09/2023    8:07 AM 10/13/2022    8:49 AM  GAD 7 : Generalized Anxiety Score  Nervous, Anxious, on Edge 0 0 0 0  Control/stop worrying 0 0 0 0  Worry too much - different things 0 0 0 0  Trouble relaxing 0 0 0 0  Restless 0 0 0 0  Easily annoyed or irritable 0 0 0 0  Afraid - awful might happen 0 0 0 0  Total GAD 7 Score 0 0 0 0  Anxiety Difficulty Not difficult at all Not difficult  at all Not difficult at all Not difficult at all       02/11/2024   11:21 AM 11/15/2023    9:01 AM 04/09/2023    8:07 AM  Depression screen PHQ 2/9  Decreased Interest 0 0 0  Down, Depressed, Hopeless 0 0 0  PHQ - 2 Score 0 0 0  Altered sleeping 0 0 0  Tired, decreased energy 0 0 0  Change in appetite 0 0 0  Feeling bad or failure about yourself  0 0 0  Trouble concentrating 0 0 0  Moving slowly or fidgety/restless 0 0 0  Suicidal thoughts 0 0 0  PHQ-9 Score 0 0 0  Difficult doing work/chores Not difficult at all Not difficult  at all Not difficult at all    BP Readings from Last 3 Encounters:  02/11/24 128/76  12/27/23 128/78  12/26/23 (!) 157/83    Physical Exam Constitutional:      Appearance: Normal appearance.  HENT:     Right Ear: Tympanic membrane is not erythematous or retracted.     Left Ear: Tympanic membrane is not erythematous or retracted.     Ears:     Comments: Both TMs dull    Nose:     Right Sinus: Maxillary sinus tenderness present. No frontal sinus tenderness.     Left Sinus: Maxillary sinus tenderness present. No frontal sinus tenderness.     Mouth/Throat:     Pharynx: No pharyngeal swelling, oropharyngeal exudate or posterior oropharyngeal erythema.  Neck:     Vascular: No carotid bruit.   Cardiovascular:     Rate and Rhythm: Normal rate and regular rhythm.     Heart sounds: No murmur heard. Pulmonary:     Effort: No respiratory distress.     Breath sounds: No wheezing or rhonchi.   Musculoskeletal:     Cervical back: Normal range of motion.     Right lower leg: No edema.     Left lower leg: No edema.  Lymphadenopathy:     Cervical: No cervical adenopathy.   Neurological:     Mental Status: He is alert.     Wt Readings from Last 3 Encounters:  02/11/24 239 lb (108.4 kg)  12/27/23 238 lb (108 kg)  12/26/23 236 lb (107 kg)    BP 128/76   Pulse 79   Temp 98.2 F (36.8 C)   Ht 5' 9 (1.753 m)   Wt 239 lb (108.4 kg)   SpO2 94%    BMI 35.29 kg/m   Assessment and Plan:  Problem List Items Addressed This Visit       Unprioritized   Essential hypertension - Primary (Chronic)   Blood pressure is well controlled.  Current medications are Cardiazem bid. Will continue same regimen along with efforts to limit dietary sodium.       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    Sheron Dixons, MD [facility] [place]

## 2024-02-25 ENCOUNTER — Other Ambulatory Visit: Payer: Self-pay | Admitting: Internal Medicine

## 2024-02-26 NOTE — Telephone Encounter (Signed)
 Requested medication (s) are due for refill today: yes  Requested medication (s) are on the active medication list: yes  Last refill:  04/10/23 #12/3  Future visit scheduled: yes  Notes to clinic:  Unable to refill per protocol, cannot delegate.      Requested Prescriptions  Pending Prescriptions Disp Refills   Vitamin D , Ergocalciferol , (DRISDOL ) 1.25 MG (50000 UNIT) CAPS capsule [Pharmacy Med Name: VIT D-2 CAP(ERGOCAL)1.25MG  50,000U] 12 capsule 3    Sig: TAKE 1 CAPSULE EVERY 7 DAYS     Endocrinology:  Vitamins - Vitamin D  Supplementation 2 Failed - 02/26/2024  2:33 PM      Failed - Manual Review: Route requests for 50,000 IU strength to the provider      Failed - Vitamin D  in normal range and within 360 days    Vit D, 25-Hydroxy  Date Value Ref Range Status  04/09/2023 12.8 (L) 30.0 - 100.0 ng/mL Final    Comment:    Vitamin D  deficiency has been defined by the Institute of Medicine and an Endocrine Society practice guideline as a level of serum 25-OH vitamin D  less than 20 ng/mL (1,2). The Endocrine Society went on to further define vitamin D  insufficiency as a level between 21 and 29 ng/mL (2). 1. IOM (Institute of Medicine). 2010. Dietary reference    intakes for calcium  and D. [place]: The    [employer]. 2. Holick MF, Binkley Brady, Bischoff-Ferrari HA, et al.    Evaluation, treatment, and prevention of vitamin D     deficiency: an Endocrine Society clinical practice    guideline. JCEM. 2011 Jul; 96(7):1911-30.          Failed - Valid encounter within last 12 months    Recent Outpatient Visits           2 weeks ago Essential hypertension   [place] [facility] at [facility], Leita DEL, MD   2 months ago Rotator cuff impingement syndrome, left   [place] [facility], Selinda PARAS, MD   3 months ago Congestion of right ear   [facility] Medicine at  [facility], Leita DEL, MD       Future Appointments             In 1 month Justus Leita DEL, MD [facility] Primary Care & Sports Medicine at [facility], [facility]   In 10 months Francisca Redell BROCKS, MD [facility] [place]            Passed - Ca in normal range and within 360 days    Calcium   Date Value Ref Range Status  04/09/2023 9.2 8.6 - 10.2 mg/dL Final

## 2024-03-17 ENCOUNTER — Other Ambulatory Visit: Payer: Self-pay

## 2024-03-17 DIAGNOSIS — M7542 Impingement syndrome of left shoulder: Secondary | ICD-10-CM

## 2024-03-17 MED ORDER — MELOXICAM 15 MG PO TABS: 15.0000 mg | ORAL_TABLET | Freq: Every day | ORAL | 1 refills | Status: AC

## 2024-04-10 ENCOUNTER — Encounter: Payer: Self-pay | Admitting: Internal Medicine

## 2024-04-18 ENCOUNTER — Encounter: Payer: Self-pay | Admitting: Internal Medicine

## 2024-04-18 ENCOUNTER — Ambulatory Visit: Payer: Self-pay

## 2024-04-18 ENCOUNTER — Ambulatory Visit: Admitting: Internal Medicine

## 2024-04-18 VITALS — BP 129/72 | HR 81 | Temp 98.1°F | Ht 69.0 in | Wt 240.0 lb

## 2024-04-18 DIAGNOSIS — J0100 Acute maxillary sinusitis, unspecified: Secondary | ICD-10-CM

## 2024-04-18 MED ORDER — PREDNISONE 10 MG PO TABS
ORAL_TABLET | ORAL | 0 refills | Status: AC
Start: 2024-04-18 — End: 2024-04-24

## 2024-04-18 MED ORDER — AMOXICILLIN-POT CLAVULANATE 875-125 MG PO TABS: 1.0000 | ORAL_TABLET | Freq: Two times a day (BID) | ORAL | 0 refills | Status: AC

## 2024-04-18 NOTE — Telephone Encounter (Signed)
 FYI Only or Action Required?: FYI only for provider.  Patient was last seen in primary care on 02/11/2024 by Justus Leita DEL, MD.  Called Nurse Triage reporting Cough.  Symptoms began a week ago.  Interventions attempted: Rest, hydration, or home remedies.  Symptoms are: unchanged.  Triage Disposition: See HCP Within 4 Hours (Or PCP Triage)  Patient/caregiver understands and will follow disposition?: Yes  Copied from CRM #[id number]. Topic: Clinical - Red Word Triage >> Apr 18, 2024  9:42 AM Winona R wrote: Pressure in chest and cough for about a week. Interested in being seen today Reason for Disposition  Wheezing is present  Answer Assessment - Initial Assessment Questions 1. ONSET: When did the cough begin?      Started a week ago 2. SEVERITY: How bad is the cough today?      Moderate to severe 3. SPUTUM: Describe the color of your sputum (e.g., none, dry cough; clear, white, yellow, green)     Yellow an green 4. HEMOPTYSIS: Are you coughing up any blood? If Yes, ask: How much? (e.g., flecks, streaks, tablespoons, etc.)     no 5. DIFFICULTY BREATHING: Are you having difficulty breathing? If Yes, ask: How bad is it? (e.g., mild, moderate, severe)      no 6. FEVER: Do you have a fever? If Yes, ask: What is your temperature, how was it measured, and when did it start?     no 7. CARDIAC HISTORY: Do you have any history of heart disease? (e.g., heart attack, congestive heart failure)      no 8. LUNG HISTORY: Do you have any history of lung disease?  (e.g., pulmonary embolus, asthma, emphysema)     Asthma that is triggered by allergies 9. PE RISK FACTORS: Do you have a history of blood clots? (or: recent major surgery, recent prolonged travel, bedridden)     no 10. OTHER SYMPTOMS: Do you have any other symptoms? (e.g., runny nose, wheezing, chest pain)       Pressure in chest, runny nose, wheezing 12. TRAVEL: Have you traveled out of the country in the  last month? (e.g., travel history, exposures)       no  Protocols used: Cough - Acute Productive-A-AH

## 2024-04-18 NOTE — Progress Notes (Signed)
 Date:  04/18/2024   Name:  Ronald Fry   DOB:  16-Feb-1961   MRN:  [id number]   Chief Complaint: URI (Patient said he was sick 10 days ago. Felt like he got better but went back to work 3 days ago and now still coughing, and lungs fill full of mucous. )  URI  This is a recurrent problem. The current episode started 1 to 4 weeks ago. The problem has been gradually worsening. There has been no fever. Associated symptoms include congestion, coughing and a sore throat. Pertinent negatives include no chest pain, headaches or wheezing.    Review of Systems  Constitutional:  Negative for chills, fatigue and fever.  HENT:  Positive for congestion, postnasal drip, sinus pressure and sore throat.   Respiratory:  Positive for cough. Negative for choking, chest tightness, shortness of breath and wheezing.   Cardiovascular:  Negative for chest pain and palpitations.  Neurological:  Negative for dizziness and headaches.     Lab Results  Component Value Date   NA 141 04/09/2023   K 4.5 04/09/2023   CO2 22 04/09/2023   GLUCOSE 111 (H) 04/09/2023   BUN 13 04/09/2023   CREATININE 1.04 04/09/2023   CALCIUM  9.2 04/09/2023   EGFR 81 04/09/2023   GFRNONAA 84 05/29/2019   Lab Results  Component Value Date   CHOL 185 04/09/2023   HDL 40 04/09/2023   LDLCALC 120 (H) 04/09/2023   TRIG 139 04/09/2023   CHOLHDL 4.6 04/09/2023   Lab Results  Component Value Date   TSH 3.440 04/09/2023   Lab Results  Component Value Date   HGBA1C 5.3 03/21/2022   Lab Results  Component Value Date   WBC 7.7 04/09/2023   HGB 16.7 04/09/2023   HCT 48.1 04/09/2023   MCV 90 04/09/2023   PLT 233 04/09/2023   Lab Results  Component Value Date   ALT 16 04/09/2023   AST 11 04/09/2023   ALKPHOS 78 04/09/2023   BILITOT 0.4 04/09/2023   Lab Results  Component Value Date   VD25OH 12.8 (L) 04/09/2023     Patient Active Problem List   Diagnosis Date Noted   Melanoma in situ of cheek (HCC) 02/11/2024    Rotator cuff impingement syndrome, left 12/27/2023   Cellulitis of left leg 12/27/2023   OSA (obstructive sleep apnea) 03/17/2021   S/P ablation of atrial fibrillation 11/17/2018   Mild hyperlipidemia 10/07/2017   Environmental and seasonal allergies 08/07/2016   Hypotestosteronism 11/02/2015   Intermittent palpitations 07/27/2015   Gastroesophageal reflux disease 07/27/2015   Asthma, mild intermittent 07/27/2015   Vitamin D  deficiency 07/27/2015   Essential hypertension 07/26/2015   Obesity, Class I, BMI 30-34.9 07/26/2015    No Known Allergies  Past Surgical History:  Procedure Laterality Date   CARDIAC ELECTROPHYSIOLOGY STUDY AND ABLATION  08/2019   COLONOSCOPY WITH PROPOFOL  N/A 11/08/2018   Procedure: COLONOSCOPY WITH BIOPSY;  Surgeon: Jinny Carmine, MD;  Location: [facility];  Service: Endoscopy;  Laterality: N/A;   MELANOMA EXCISION     Eyebrow   POLYPECTOMY N/A 11/08/2018   Procedure: POLYPECTOMY;  Surgeon: Jinny Carmine, MD;  Location: [facility];  Service: Endoscopy;  Laterality: N/A;   TREATMENT FISTULA ANAL      Social History   Tobacco Use   Smoking status: Never   Smokeless tobacco: Never  Vaping Use   Vaping status: Never Used  Substance Use Topics   Alcohol use: Yes    Alcohol/week: 1.0 standard drink  of alcohol    Types: 1 Cans of beer per week   Drug use: No     Medication list has been reviewed and updated.  Current Meds  Medication Sig   albuterol  (PROAIR  HFA) 108 (90 Base) MCG/ACT inhaler Inhale 2 puffs into the lungs every 6 (six) hours as needed for wheezing or shortness of breath.   albuterol  (PROVENTIL ) (2.5 MG/3ML) 0.083% nebulizer solution Take 3 mLs (2.5 mg total) by nebulization every 6 (six) hours as needed for wheezing or shortness of breath.   amoxicillin -clavulanate (AUGMENTIN ) 875-125 MG tablet Take 1 tablet by mouth 2 (two) times daily for 10 days.   budesonide -formoterol  (SYMBICORT ) 160-4.5 MCG/ACT inhaler Inhale 2  puffs into the lungs 2 (two) times daily.   CARTIA  XT 120 MG 24 hr capsule TAKE 1 CAPSULE TWICE A DAY   levocetirizine (XYZAL) 2.5 MG/5ML solution Take 2.5 mg by mouth every evening.   meloxicam  (MOBIC ) 15 MG tablet Take 1 tablet (15 mg total) by mouth daily.   predniSONE  (DELTASONE ) 10 MG tablet Take 6 tablets (60 mg total) by mouth daily with breakfast for 1 day, THEN 5 tablets (50 mg total) daily with breakfast for 1 day, THEN 4 tablets (40 mg total) daily with breakfast for 1 day, THEN 3 tablets (30 mg total) daily with breakfast for 1 day, THEN 2 tablets (20 mg total) daily with breakfast for 1 day, THEN 1 tablet (10 mg total) daily with breakfast for 1 day.   tadalafil  (CIALIS ) 20 MG tablet Take 0.5-1 tablets (10-20 mg total) by mouth daily as needed for erectile dysfunction.   triamcinolone (NASACORT ALLERGY 24HR) 55 MCG/ACT AERO nasal inhaler Place 2 sprays into the nose daily.   Vitamin D , Ergocalciferol , (DRISDOL ) 1.25 MG (50000 UNIT) CAPS capsule TAKE 1 CAPSULE EVERY 7 DAYS       04/18/2024    2:45 PM 02/11/2024   11:22 AM 11/15/2023    9:01 AM 04/09/2023    8:07 AM  GAD 7 : Generalized Anxiety Score  Nervous, Anxious, on Edge 0 0 0 0  Control/stop worrying 0 0 0 0  Worry too much - different things 0 0 0 0  Trouble relaxing 0 0 0 0  Restless 0 0 0 0  Easily annoyed or irritable 0 0 0 0  Afraid - awful might happen 0 0 0 0  Total GAD 7 Score 0 0 0 0  Anxiety Difficulty Not difficult at all Not difficult at all Not difficult at all Not difficult at all       04/18/2024    2:45 PM 02/11/2024   11:21 AM 11/15/2023    9:01 AM  Depression screen PHQ 2/9  Decreased Interest 0 0 0  Down, Depressed, Hopeless 0 0 0  PHQ - 2 Score 0 0 0  Altered sleeping 0 0 0  Tired, decreased energy 0 0 0  Change in appetite 0 0 0  Feeling bad or failure about yourself  0 0 0  Trouble concentrating 0 0 0  Moving slowly or fidgety/restless 0 0 0  Suicidal thoughts 0 0 0  PHQ-9 Score 0 0 0   Difficult doing work/chores Not difficult at all Not difficult at all Not difficult at all    BP Readings from Last 3 Encounters:  04/18/24 129/72  02/11/24 128/76  12/27/23 128/78    Physical Exam Constitutional:      Appearance: Normal appearance. He is well-developed.  HENT:     Right Ear:  Ear canal and external ear normal. Tympanic membrane is not erythematous or retracted.     Left Ear: Ear canal and external ear normal. Tympanic membrane is not erythematous or retracted.     Nose:     Right Sinus: Maxillary sinus tenderness present. No frontal sinus tenderness.     Left Sinus: Maxillary sinus tenderness present. No frontal sinus tenderness.     Mouth/Throat:     Mouth: No oral lesions.     Pharynx: Posterior oropharyngeal erythema present.  Cardiovascular:     Rate and Rhythm: Normal rate and regular rhythm.     Heart sounds: Normal heart sounds.  Pulmonary:     Breath sounds: Normal breath sounds. No wheezing or rales.  Lymphadenopathy:     Cervical: No cervical adenopathy.  Neurological:     Mental Status: He is alert and oriented to person, place, and time.     Wt Readings from Last 3 Encounters:  04/18/24 240 lb (108.9 kg)  02/11/24 239 lb (108.4 kg)  12/27/23 238 lb (108 kg)    BP 129/72   Pulse 81   Temp 98.1 F (36.7 C) (Oral)   Ht 5' 9 (1.753 m)   Wt 240 lb (108.9 kg)   SpO2 98%   BMI 35.44 kg/m   Assessment and Plan:  Problem List Items Addressed This Visit   None Visit Diagnoses       Acute non-recurrent maxillary sinusitis    -  Primary   Sinusitis after viral URI continue xyzal and flonase  NS Augmentin  and steroid taper   Relevant Medications   amoxicillin -clavulanate (AUGMENTIN ) 875-125 MG tablet   predniSONE  (DELTASONE ) 10 MG tablet       No follow-ups on file.    Leita HILARIO Adie, MD [facility] [place]

## 2024-04-18 NOTE — Telephone Encounter (Signed)
 Noted  Pt has appt.  KP

## 2024-04-19 ENCOUNTER — Other Ambulatory Visit: Payer: Self-pay | Admitting: Internal Medicine

## 2024-04-19 DIAGNOSIS — M7542 Impingement syndrome of left shoulder: Secondary | ICD-10-CM

## 2024-04-21 NOTE — Telephone Encounter (Signed)
 Requested Prescriptions  Refused Prescriptions Disp Refills   meloxicam  (MOBIC ) 15 MG tablet [Pharmacy Med Name: MELOXICAM  15MG  TABLETS] 30 tablet 1    Sig: TAKE 1 TABLET BY MOUTH DAILY FOR 2 WEEKS     Analgesics:  COX2 Inhibitors Failed - 04/21/2024  2:04 PM      Failed - Manual Review: Labs are only required if the patient has taken medication for more than 8 weeks.      Failed - HGB in normal range and within 360 days    Hemoglobin  Date Value Ref Range Status  04/09/2023 16.7 13.0 - 17.7 g/dL Final         Failed - Cr in normal range and within 360 days    Creatinine, Ser  Date Value Ref Range Status  04/09/2023 1.04 0.76 - 1.27 mg/dL Final         Failed - HCT in normal range and within 360 days    Hematocrit  Date Value Ref Range Status  04/09/2023 48.1 37.5 - 51.0 % Final         Failed - AST in normal range and within 360 days    AST  Date Value Ref Range Status  04/09/2023 11 0 - 40 IU/L Final         Failed - ALT in normal range and within 360 days    ALT  Date Value Ref Range Status  04/09/2023 16 0 - 44 IU/L Final         Failed - eGFR is 30 or above and within 360 days    GFR calc Af Amer  Date Value Ref Range Status  05/29/2019 97 >59 mL/min/1.73 Final   GFR calc non Af Amer  Date Value Ref Range Status  05/29/2019 84 >59 mL/min/1.73 Final   eGFR  Date Value Ref Range Status  04/09/2023 81 >59 mL/min/1.73 Final         Passed - Patient is not pregnant      Passed - Valid encounter within last 12 months    Recent Outpatient Visits           3 days ago Acute non-recurrent maxillary sinusitis   [place] [facility], Leita DEL, MD   2 months ago Essential hypertension   [place] [facility], Leita DEL, MD   3 months ago Rotator cuff impingement syndrome, left   [place] [facility], Selinda PARAS, MD    5 months ago Congestion of right ear   [facility], Leita DEL, MD       Future Appointments             In 8 months Francisca, Redell BROCKS, MD [facility] [place]

## 2024-07-11 ENCOUNTER — Other Ambulatory Visit: Payer: Self-pay | Admitting: Internal Medicine

## 2024-07-11 DIAGNOSIS — I10 Essential (primary) hypertension: Secondary | ICD-10-CM

## 2024-07-14 NOTE — Telephone Encounter (Signed)
 Requested Prescriptions  Pending Prescriptions Disp Refills   diltiazem  (CARDIZEM  CD) 120 MG 24 hr capsule [Pharmacy Med Name: DILTIAZEM  ER (CARDIZEM  CD) CAPS 120MG ] 180 capsule 0    Sig: TAKE 1 CAPSULE TWICE A DAY     Cardiovascular: Calcium  Channel Blockers 3 Failed - 07/14/2024  4:24 PM      Failed - ALT in normal range and within 360 days    ALT  Date Value Ref Range Status  04/09/2023 16 0 - 44 IU/L Final         Failed - AST in normal range and within 360 days    AST  Date Value Ref Range Status  04/09/2023 11 0 - 40 IU/L Final         Failed - Cr in normal range and within 360 days    Creatinine, Ser  Date Value Ref Range Status  04/09/2023 1.04 0.76 - 1.27 mg/dL Final         Passed - Last BP in normal range    BP Readings from Last 1 Encounters:  04/18/24 129/72         Passed - Last Heart Rate in normal range    Pulse Readings from Last 1 Encounters:  04/18/24 81         Passed - Valid encounter within last 6 months    Recent Outpatient Visits           2 months ago Acute non-recurrent maxillary sinusitis   [place] [facility], Leita DEL, MD   5 months ago Essential hypertension   [place] [facility], Leita DEL, MD   6 months ago Rotator cuff impingement syndrome, left   [place] [facility], Selinda PARAS, MD   8 months ago Congestion of right ear   [facility] at [facility], Leita DEL, MD       Future Appointments             In 5 months Francisca, Redell BROCKS, MD [facility] [place]

## 2024-12-30 ENCOUNTER — Ambulatory Visit: Admitting: Urology
